# Patient Record
Sex: Female | Born: 1990 | Race: White | Hispanic: No | Marital: Married | Smoking: Never smoker
Health system: Southern US, Community
[De-identification: ages and names within clinical notes are randomized; demographics above are authoritative.]

## PROBLEM LIST (undated history)

## (undated) DIAGNOSIS — N83209 Unspecified ovarian cyst, unspecified side: Secondary | ICD-10-CM

## (undated) HISTORY — DX: Unspecified ovarian cyst, unspecified side: N83.209

## (undated) HISTORY — PX: APPENDECTOMY: SHX54

---

## 2013-03-09 ENCOUNTER — Emergency Department: Payer: Self-pay | Admitting: Emergency Medicine

## 2013-03-09 LAB — CBC WITH DIFFERENTIAL/PLATELET
Basophil #: 0 10*3/uL (ref 0.0–0.1)
Eosinophil %: 0 %
HCT: 34.2 % — ABNORMAL LOW (ref 35.0–47.0)
HGB: 12 g/dL (ref 12.0–16.0)
Lymphocyte #: 0.8 10*3/uL — ABNORMAL LOW (ref 1.0–3.6)
Lymphocyte %: 4.6 %
MCH: 32 pg (ref 26.0–34.0)
MCHC: 34.9 g/dL (ref 32.0–36.0)
MCV: 92 fL (ref 80–100)
Neutrophil #: 16.3 10*3/uL — ABNORMAL HIGH (ref 1.4–6.5)
Neutrophil %: 93.4 %
Platelet: 251 10*3/uL (ref 150–440)
RBC: 3.74 10*6/uL — ABNORMAL LOW (ref 3.80–5.20)
WBC: 17.5 10*3/uL — ABNORMAL HIGH (ref 3.6–11.0)

## 2013-03-09 LAB — COMPREHENSIVE METABOLIC PANEL
Albumin: 4 g/dL (ref 3.4–5.0)
Anion Gap: 4 — ABNORMAL LOW (ref 7–16)
BUN: 21 mg/dL — ABNORMAL HIGH (ref 7–18)
Bilirubin,Total: 0.4 mg/dL (ref 0.2–1.0)
Chloride: 106 mmol/L (ref 98–107)
Creatinine: 0.72 mg/dL (ref 0.60–1.30)
EGFR (Non-African Amer.): >60
Osmolality: 276 (ref 275–301)
Potassium: 3.7 mmol/L (ref 3.5–5.1)
SGOT(AST): 23 U/L (ref 15–37)
Sodium: 136 mmol/L (ref 136–145)
Total Protein: 7 g/dL (ref 6.4–8.2)

## 2013-03-09 LAB — URINALYSIS, COMPLETE
Blood: NEGATIVE
Glucose,UR: NEGATIVE mg/dL (ref 0–75)
Leukocyte Esterase: NEGATIVE
Nitrite: NEGATIVE
Ph: 5 (ref 4.5–8.0)
Protein: 30
Specific Gravity: 1.027 (ref 1.003–1.030)
Squamous Epithelial: 43
WBC UR: 12 /HPF (ref 0–5)

## 2013-03-09 LAB — CBC
HCT: 27.9 % — ABNORMAL LOW (ref 35.0–47.0)
HGB: 9.7 g/dL — ABNORMAL LOW (ref 12.0–16.0)
MCH: 32 pg (ref 26.0–34.0)
MCV: 93 fL (ref 80–100)
RBC: 3.02 10*6/uL — ABNORMAL LOW (ref 3.80–5.20)
WBC: 12.2 10*3/uL — ABNORMAL HIGH (ref 3.6–11.0)

## 2013-03-09 LAB — LIPASE, BLOOD: Lipase: 88 U/L (ref 73–393)

## 2013-12-19 IMAGING — US US PELV - US TRANSVAGINAL
1 series · 14 of 25 positions shown · non-contrast
Comparison: CT 03/09/2013

CLINICAL DATA: Hemoperitoneum.  Evaluate for ruptured cyst.

EXAM:
TRANSABDOMINAL AND TRANSVAGINAL ULTRASOUND OF PELVIS
TECHNIQUE: Both transabdominal and transvaginal ultrasound examinations of the
pelvis were performed. Transabdominal technique was performed for
global imaging of the pelvis including uterus, ovaries, adnexal
regions, and pelvic cul-de-sac. It was necessary to proceed with
endovaginal exam following the transabdominal exam to visualize the
right ovary.

[Series 1: us pelv - us transvaginal · 0.28mm/px · 85 acquisitions, 14 frames shown]
[im 1/85]
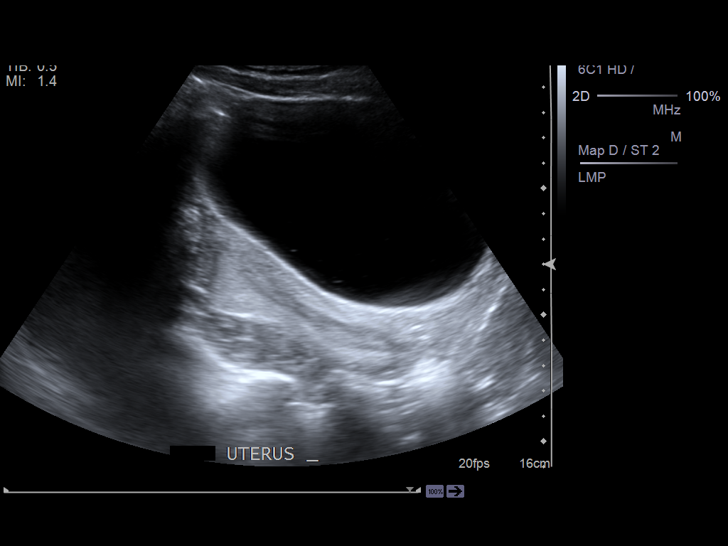
[im 8/85]
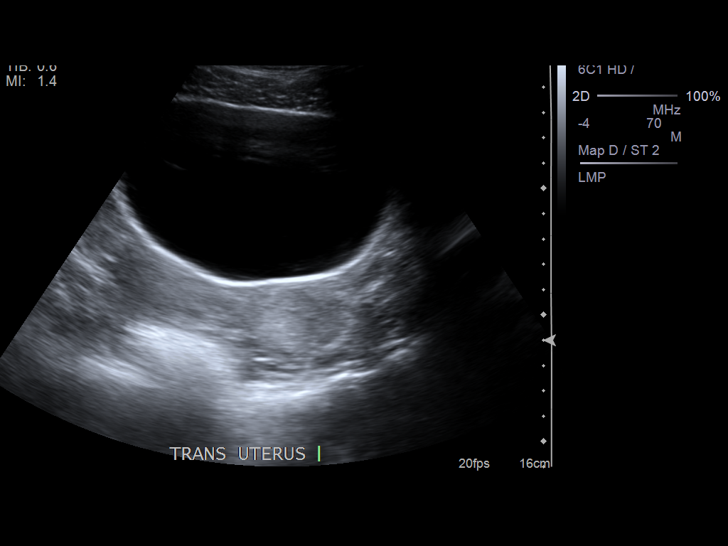
[im 15/85]
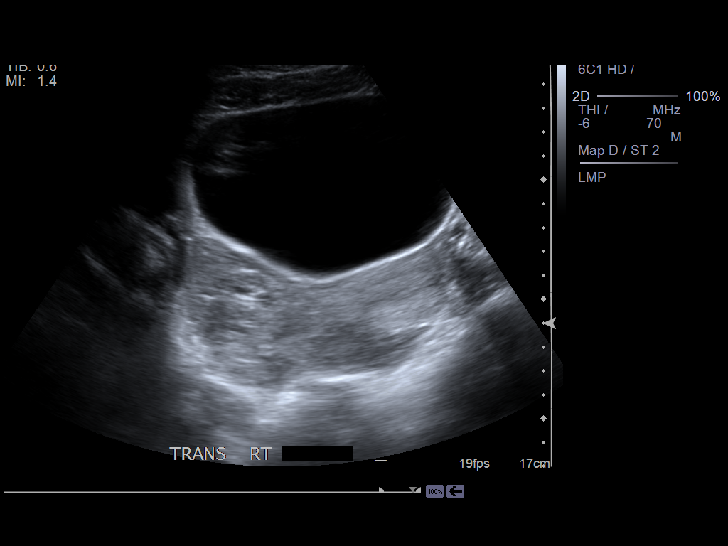
[im 22/85]
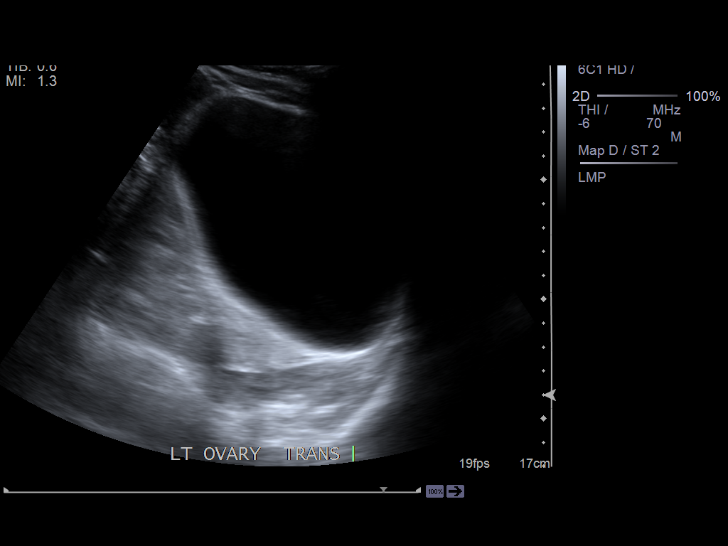
[im 29/85]
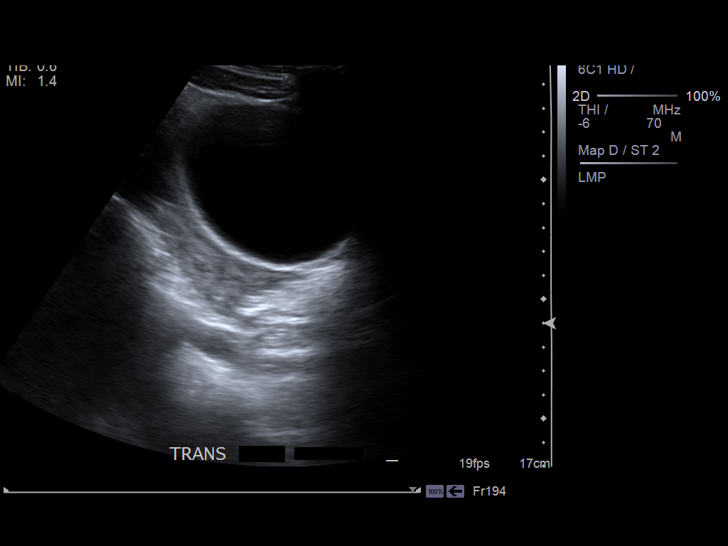
[im 32/85]
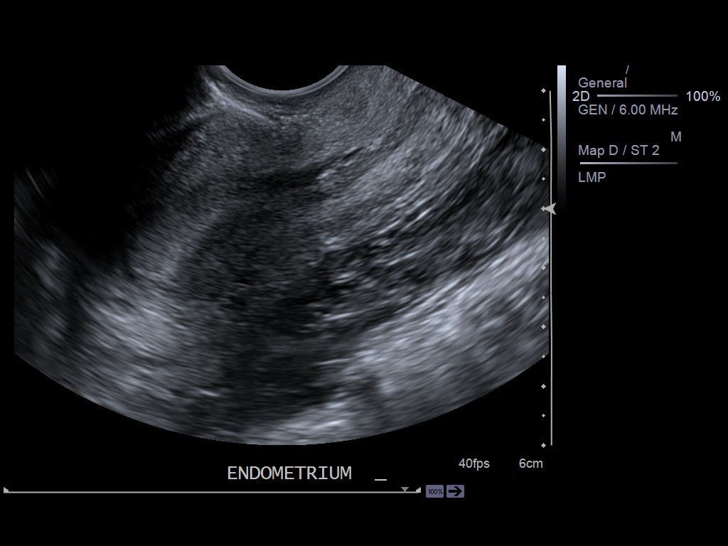
[im 39/85]
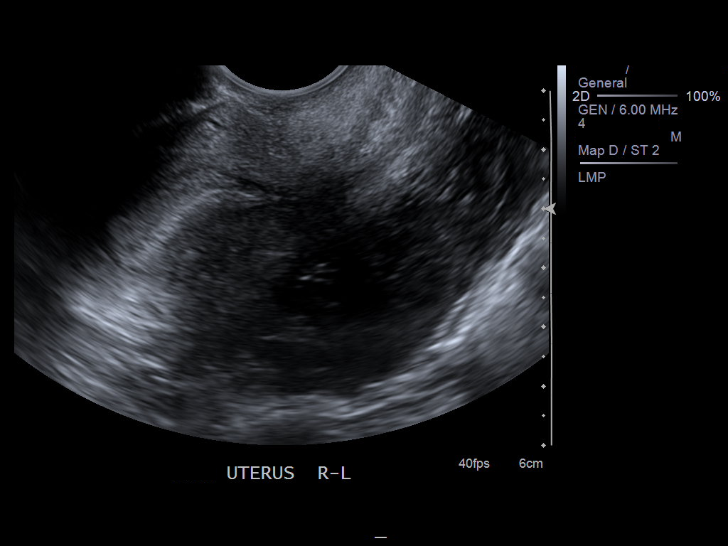
[im 46/85]
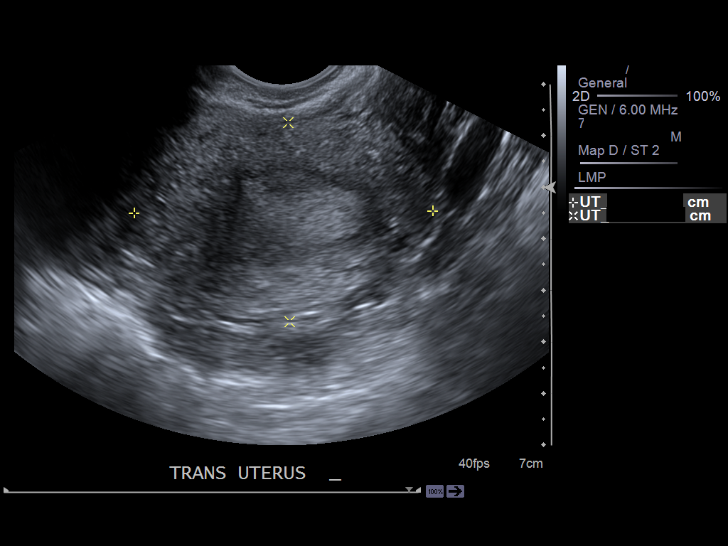
[im 53/85]
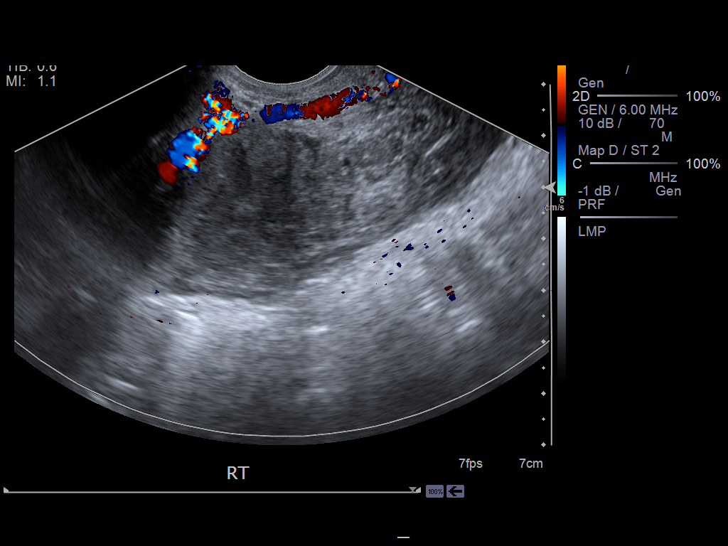
[im 57/85]
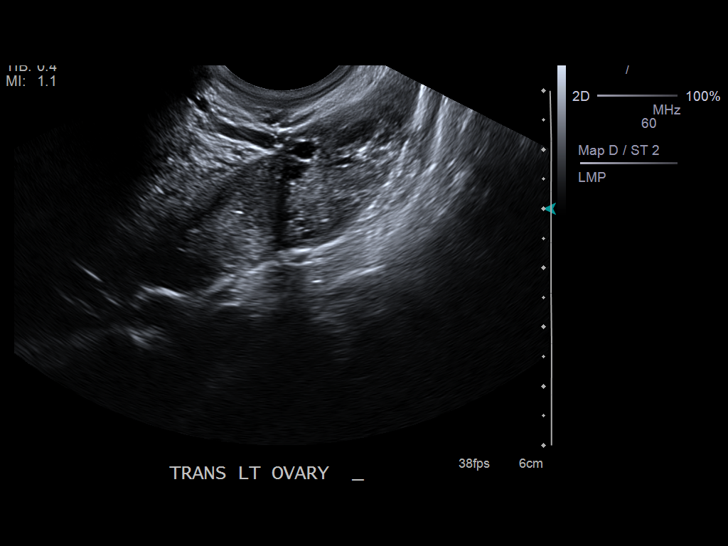
[im 64/85]
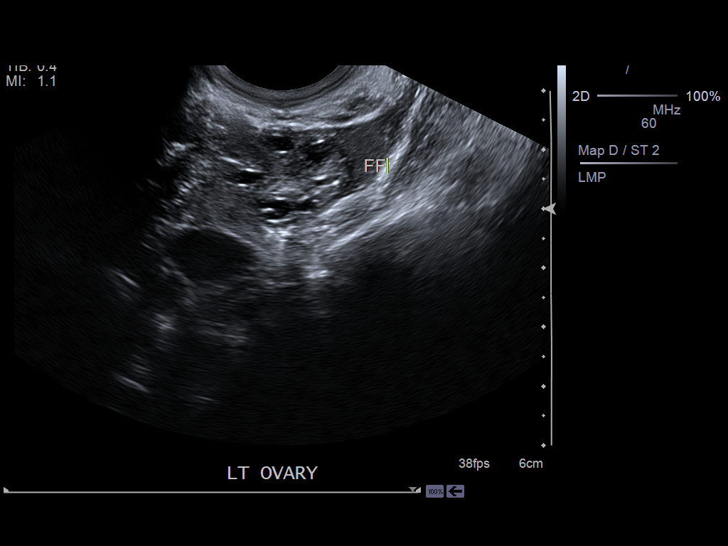
[im 71/85]
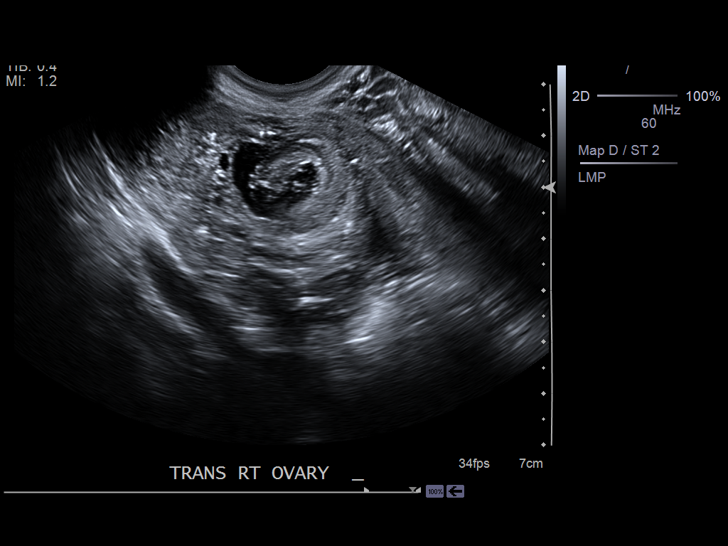
[im 78/85]
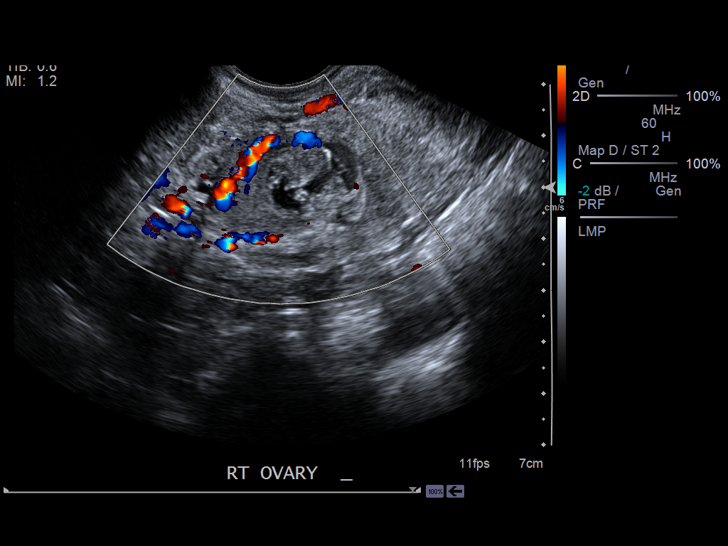
[im 85/85]
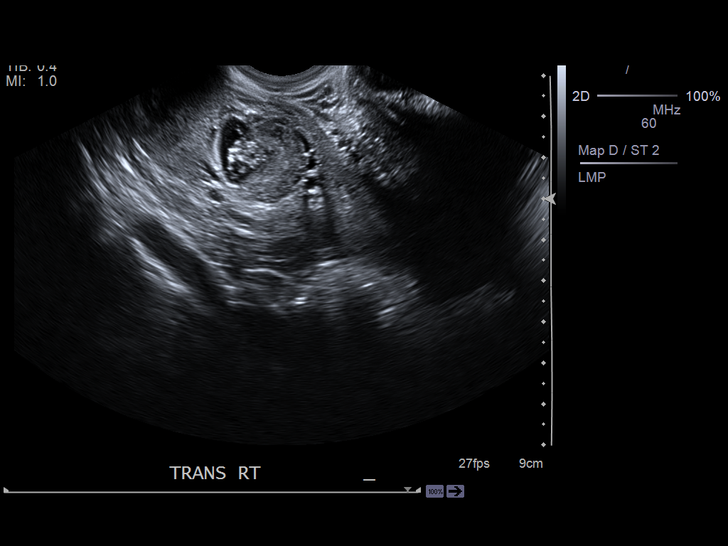

[14 of 25 positions shown; findings below may reference images not displayed]

FINDINGS: Uterus

Measurements: 8.2 x 3.9 x 5.8 cm. No fibroids or other mass
visualized.

Endometrium

Thickness: 12 mm. Fluid is identified within the lower uterine
segment.

Right ovary

Measurements: 2.3 x 3.2 x 1.9 cm.. Complex cystic mass in the right
ovary measures 2.4 x 1.7 x 1.9 cm.

Left ovary

Measurements: 3.1 x 3.7 x 1.8 cm. Normal appearance/no adnexal mass.

Other findings

Complex fluid is identified compatible with hemoperitoneum.
IMPRESSION: 1. Complex mass in right ovary compatible with collapsed a
hemorrhagic cyst. In the setting of a negative per pregnancy test is
most likely represents sequelae of a ruptured hemorrhagic cyst.

## 2013-12-19 IMAGING — CT CT ABD-PELV W/ CM
2 of 4 series · 17 of 46 positions shown, 19 images · IV contrast (isovue)
Comparison: None.

CLINICAL DATA: Generalized abdominal pain, worse in the right lower
quadrant

EXAM:
CT ABDOMEN AND PELVIS WITH CONTRAST
TECHNIQUE: Multidetector CT imaging of the abdomen and pelvis was performed
using the standard protocol following bolus administration of
intravenous contrast.
CONTRAST:  125 cc Isovue 370 intravenous

[Series 2: routine abd pel with · axial · 0.63mm/px · z∈[-474,-40]mm · 14 of 95 slices shown, 16 images]
[im 4/95  soft-tissue]
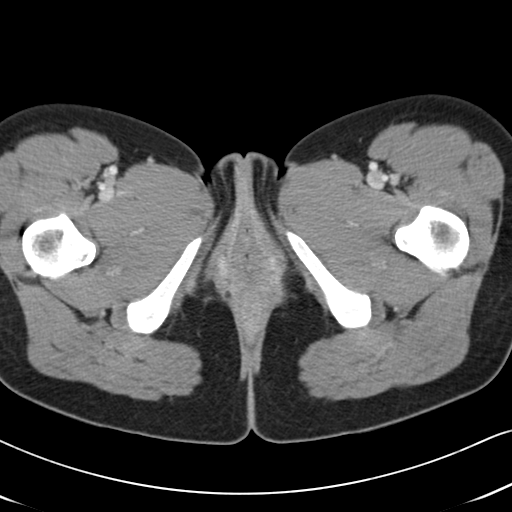
[im 4/95  bone]
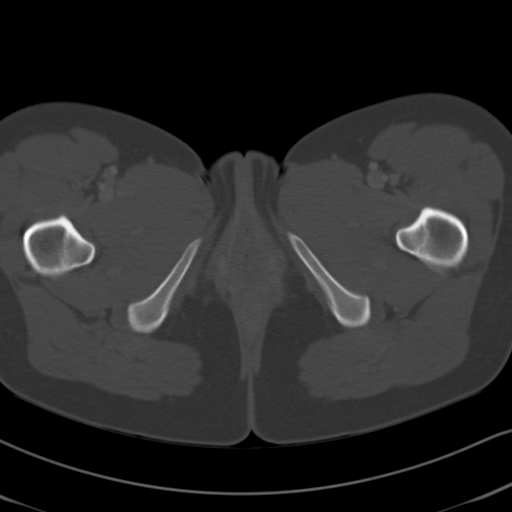
[im 12/95  soft-tissue]
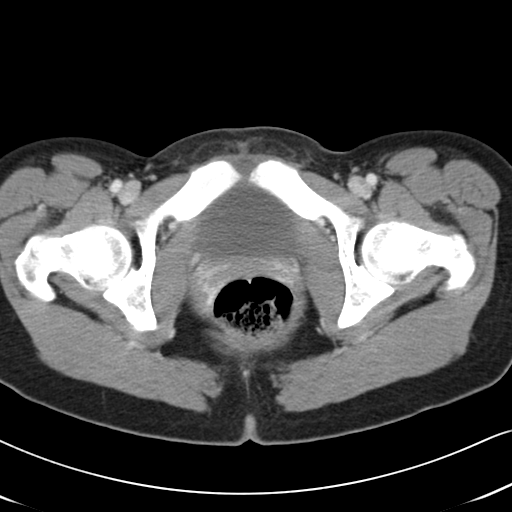
[im 19/95  soft-tissue]
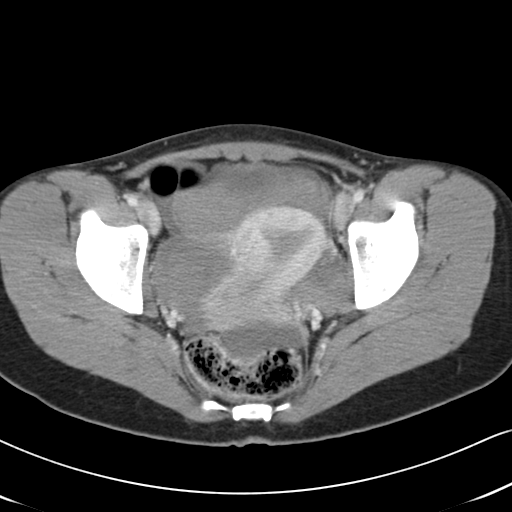
[im 27/95  soft-tissue]
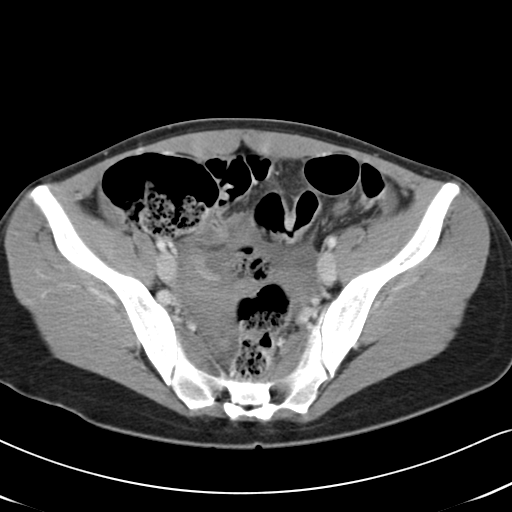
[im 31/95  soft-tissue]
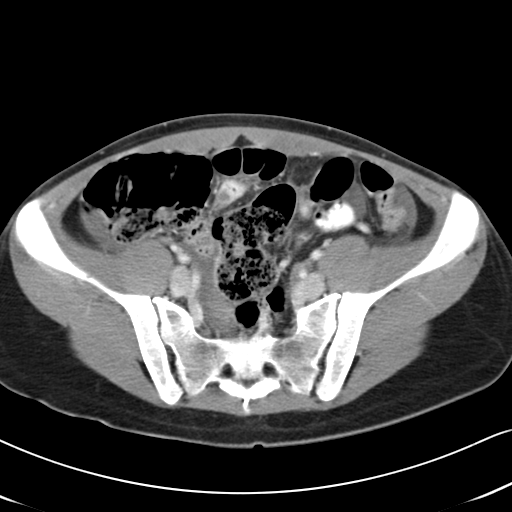
[im 38/95  soft-tissue]
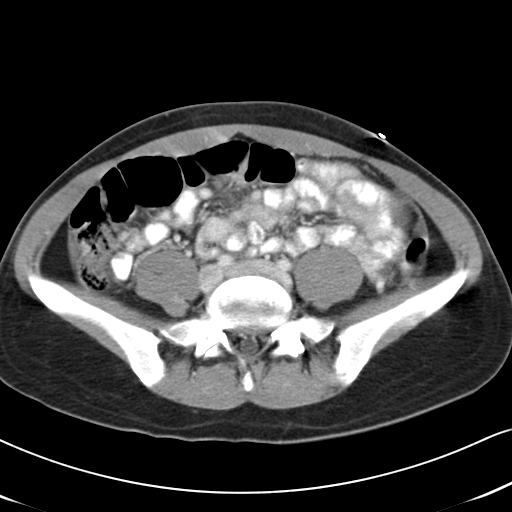
[im 46/95  soft-tissue]
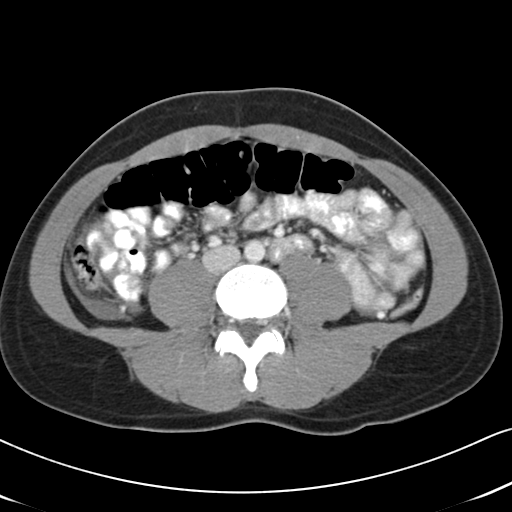
[im 49/95  soft-tissue]
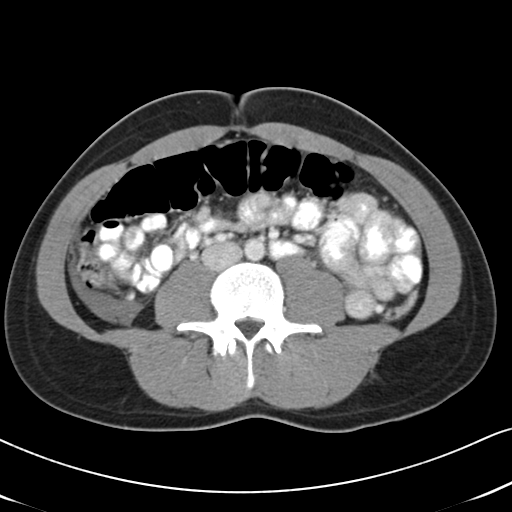
[im 57/95  soft-tissue]
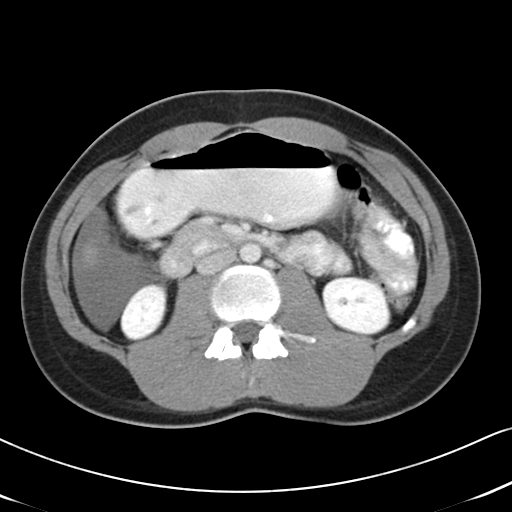
[im 57/95  bone]
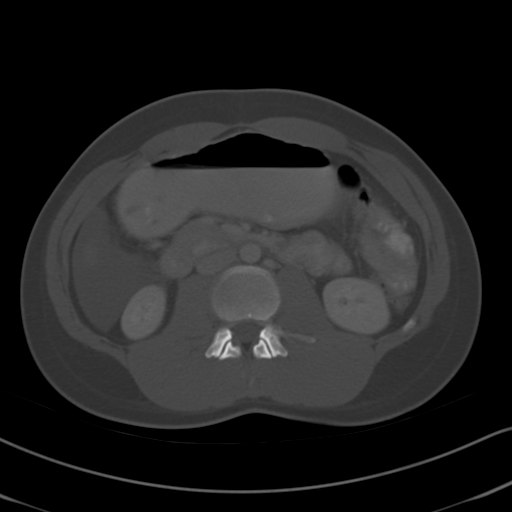
[im 64/95  soft-tissue]
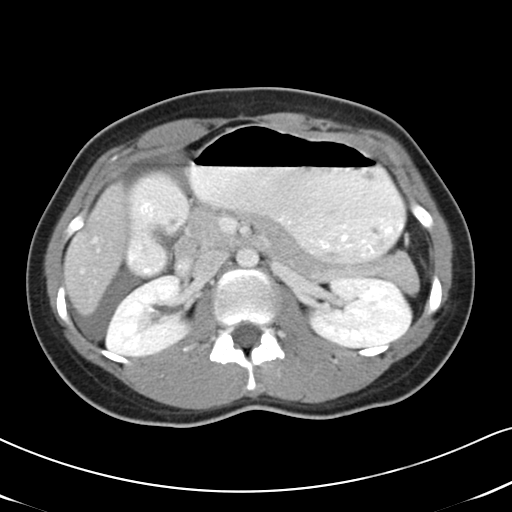
[im 72/95  soft-tissue]
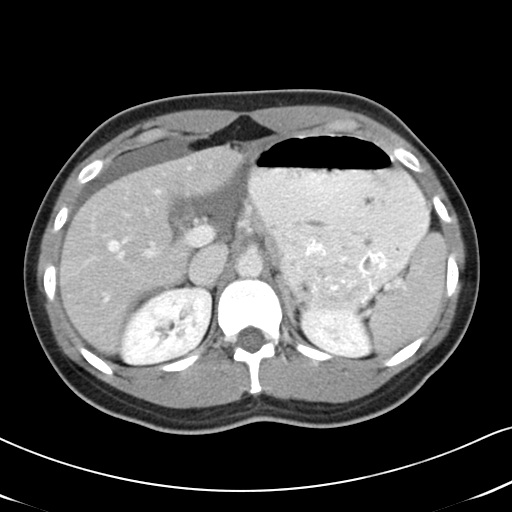
[im 76/95  soft-tissue]
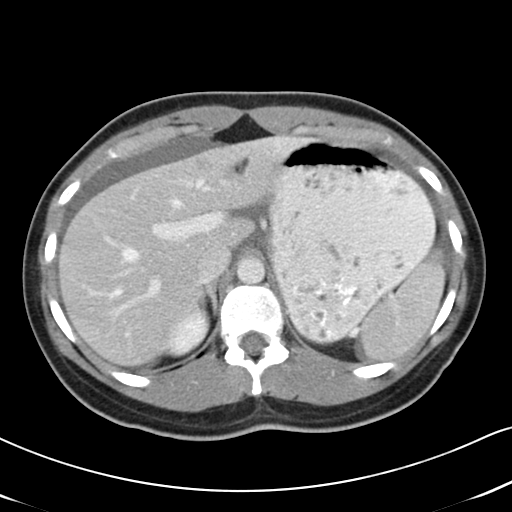
[im 83/95  soft-tissue]
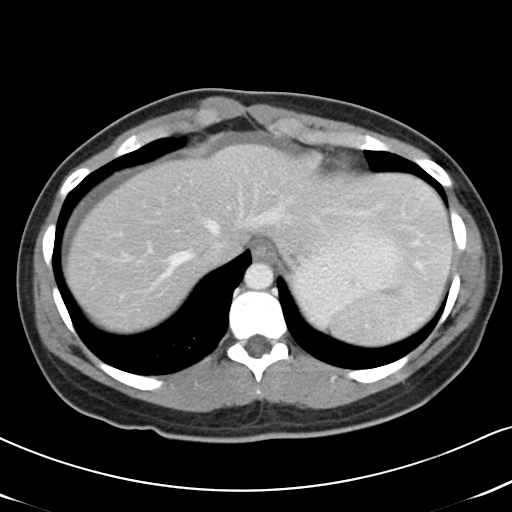
[im 91/95  soft-tissue]
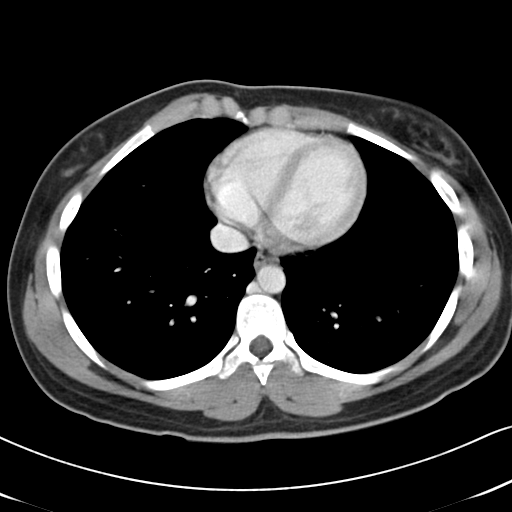

[Series 5: cor routine abd pel with · coronal · 0.94mm/px · 3 of 109 slices shown]
[im 37/109  soft-tissue]
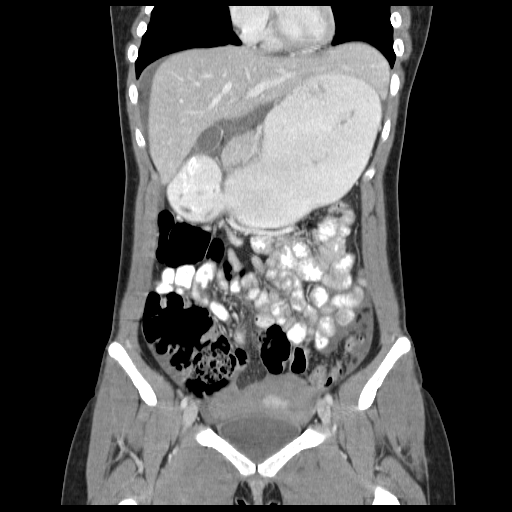
[im 49/109  soft-tissue]
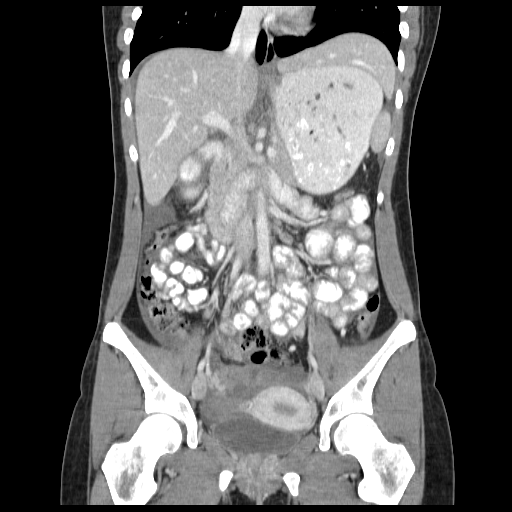
[im 61/109  soft-tissue]
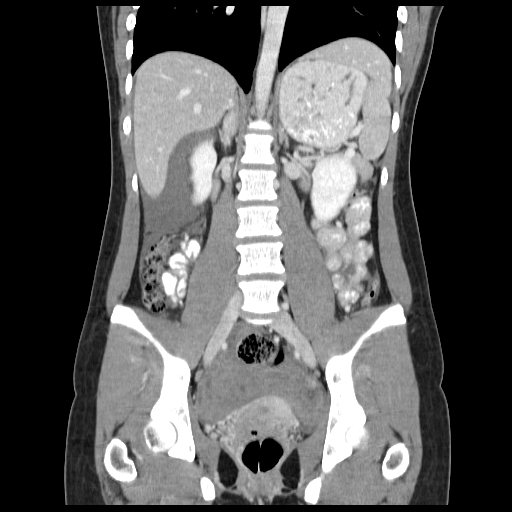

[17 of 46 positions shown; findings below may reference images not displayed]

FINDINGS: BODY WALL: Unremarkable.

LOWER CHEST: Unremarkable.

ABDOMEN/PELVIS:

Liver: No focal abnormality.

Biliary: No evidence of biliary obstruction or stone.

Pancreas: Unremarkable.

Spleen: Unremarkable.

Adrenals: Unremarkable.

Kidneys and ureters: No hydronephrosis or stone.

Bladder: Unremarkable.

Reproductive: See below

Bowel: No obstruction. Normal appendix.

Retroperitoneum: No mass or adenopathy.

Peritoneum: Moderate hemo peritoneum, with the blood clot centered
in the pelvis. More fluid density extends to the level of the
diaphragm. The likely source is the right adnexa, probably from
ruptured hemorrhagic cyst. Amorphous contrast within the right
adnexal region could represent ongoing hemorrhage or intravascular
contrast within the wall of the corpus luteum cyst. Delayed imaging
not performed.

OSSEOUS: No acute abnormalities.

Critical Value/emergent results were called by telephone at the time
of interpretation on 03/09/2013 at [DATE] to Dr.ZYRIE REBEAUD ,
who verbally acknowledged these results.
IMPRESSION: Moderate hemoperitoneum, most likely from ruptured hemorrhagic
ovarian cyst on the right. There may be ongoing hemorrhage.

## 2014-08-08 NOTE — H&P (Signed)
Subjective/Chief Complaint abdominal pain   History of Present Illness 24 yo G0 presents to the Emergency Room at [facility] with worsening abdominal pain.  She states the pain began around 1130 last night and gradually worsened whent, at 430 am, her pain became bad enough to have her come to the Emergency Room.  She states that pain is located in her lower abdomen and in her shoulder.  She states the pain otherwise doesn't radiate.  The pain is described as 10 out of 10 and sharp and constant.  Nothing alleviates the pain, though she has received a total of 27m (179m 86m85m and 2mg59mf morphine in the ER this morning and her pain is now a 6 out of 10.  She notes no aggravating factors.  She had some associated dizzyness once, but none now.  She denies fevers, chills, nausea, emesis, urinary symptom, GI symptoms.  Her LMP was approximately 11/1.  She also denies any vaginal symptoms.   Past History Past Medical History:  No medical problems Past Surgical History: None OB/GYN history: G0, LMP 02/16/13, Denies history of STD, never had a pap smear   Past Med/Surgical Hx:  denies:   ALLERGIES:  Amoxicillin: Rash   Medications No home medications   Family and Social History:  Family History Non-Contributory   Social History negative tobacco, negative ETOH, negative Illicit drugs   Place of Living Home   Review of Systems:  Fever/Chills No   Cough No   Abdominal Pain Yes   Diarrhea No   Constipation No   Nausea/Vomiting No   SOB/DOE No   Chest Pain No   Medications/Allergies Reviewed Medications/Allergies reviewed   Physical Exam:  GEN well developed, well nourished, no acute distress   NECK supple  No masses  trachea midline   RESP normal resp effort  clear BS   CARD regular rate   ABD soft, mild tenderness to palpation in lower abdomen, no rebound, no guarding, +BS   GU superpubic tenderness  deferred   EXTR negative  cyanosis/clubbing, negative edema   SKIN normal to palpation   NEURO cranial nerves intact, motor/sensory function intact   PSYCH A+O to time, place, person   Additional Comments Height : 5 feet, 4 inch, BMI (kg/m2) : 23.1  Temp 98 degrees F, Pulse 111, RR 18, BP : 114/64, Pulse Ox % : 98 % Pain Scale (0-10) Pain Scale (0-10) : Scale:7  CT abd/pelvis w/ contrast: hemoperitoneum/pelvis with possible rupture right ovarian corpus luteal cyst.   Lab Results:  Hepatic:  22-Nov-14 05:13   Bilirubin, Total 0.4  Alkaline Phosphatase 46 (45-117 NOTE: New Reference Range 03/08/13)  SGPT (ALT) 17  SGOT (AST) 23  Total Protein, Serum 7.0  Albumin, Serum 4.0  Routine Chem:  22-Nov-14 05:13   Lipase 88 (Result(s) reported on 09 Mar 2013 at 05:37AM.)  Glucose, Serum  111  BUN  21  Creatinine (comp) 0.72  Sodium, Serum 136  Potassium, Serum 3.7  Chloride, Serum 106  CO2, Serum 26  Calcium (Total), Serum 8.9  Osmolality (calc) 276  eGFR (African American) >60  eGFR (Non-African American) >60 (eGFR values <60mL34m/1.73 m2 may be an indication of chronic kidney disease (CKD). Calculated eGFR is useful in patients with stable renal function. The eGFR calculation will not be reliable in acutely ill patients when serum creatinine is changing rapidly. It is not useful in  patients on dialysis. The eGFR calculation may not be applicable to patients  at the low and high extremes of body sizes, pregnant women, and vegetarians.)  Anion Gap  4  Routine UA:  22-Nov-14 05:13   Color (UA) Yellow  Clarity (UA) Cloudy  Glucose (UA) Negative  Bilirubin (UA) Negative  Ketones (UA) 2+  Specific Gravity (UA) 1.027  Blood (UA) Negative  pH (UA) 5.0  Protein (UA) 30 mg/dL  Nitrite (UA) Negative  Leukocyte Esterase (UA) Negative (Result(s) reported on 09 Mar 2013 at 06:13AM.)  RBC (UA) 2 /HPF  WBC (UA) 12 /HPF  Bacteria (UA) 1+  Epithelial Cells (UA) 43 /HPF  Mucous (UA) PRESENT (Result(s)  reported on 09 Mar 2013 at 06:13AM.)  Routine Hem:  22-Nov-14 05:13   WBC (CBC)  17.5  RBC (CBC)  3.74  Hemoglobin (CBC) 12.0  Hematocrit (CBC)  34.2  Platelet Count (CBC) 251  MCV 92  MCH 32.0  MCHC 34.9  RDW 12.0  Neutrophil % 93.4  Lymphocyte % 4.6  Monocyte % 1.9  Eosinophil % 0.0  Basophil % 0.1  Neutrophil #  16.3  Lymphocyte #  0.8  Monocyte # 0.3  Eosinophil # 0.0  Basophil # 0.0 (Result(s) reported on 09 Mar 2013 at 05:37AM.)   Radiology Results: CT:    22-Nov-14 06:16, CT Abdomen and Pelvis With Contrast  CT Abdomen and Pelvis With Contrast  REASON FOR EXAM:    (1) generalized abdominal pain worse RLQ; (2)   generalized abdominal pain worse R  COMMENTS:       PROCEDURE: CT  - CT ABDOMEN / PELVIS  W  - Mar 09 2013  6:16AM     CLINICAL DATA:  Generalized abdominal pain, worse in the right lower  quadrant    EXAM:  CT ABDOMEN AND PELVIS WITH CONTRAST    TECHNIQUE:  Multidetector CT imaging of the abdomen and pelvis was performed  using the standard protocol following bolus administration of  intravenous contrast.    CONTRAST:  125 cc Isovue 370 intravenous    COMPARISON:  None.    FINDINGS:  BODY WALL: Unremarkable.    LOWER CHEST: Unremarkable.    ABDOMEN/PELVIS:    Liver: No focal abnormality.  Biliary: No evidence of biliary obstruction or stone.    Pancreas: Unremarkable.    Spleen: Unremarkable.    Adrenals: Unremarkable.    Kidneys and ureters: No hydronephrosis or stone.    Bladder: Unremarkable.    Reproductive: See below    Bowel: No obstruction. Normal appendix.  Retroperitoneum: No mass or adenopathy.    Peritoneum: Moderate hemo peritoneum, with the blood clot centered  in the pelvis. More fluid density extends to the level of the  diaphragm. The likely source is the right adnexa, probably from  ruptured hemorrhagic cyst. Amorphous contrast within the right  adnexal region could represent ongoing hemorrhage or  intravascular  contrast within the wall of the corpus luteum cyst. Delayed imaging  not performed.    OSSEOUS: No acute abnormalities.    Critical Value/emergent results were called by telephone at the time  of interpretation on 03/09/2013 at 6:38 AM to [facility] ,  who verbally acknowledged these results.   IMPRESSION:  Moderate hemoperitoneum, most likely from ruptured hemorrhagic  ovarian cyst on the right. There may be ongoing hemorrhage.      Electronically Signed    By: Jorje Guild M.D.    On: 03/09/2013 06:42         Verified By: Gilford Silvius, M.D.,    Assessment/Admission Diagnosis  24 yo G0 with hemoperitoneum with possible gynecologic source, possible ruptured hemorrhagic cyst   Plan - Recommend pelvic ultrasound to better characterize (discussed with patient that she will likely need a transvaginal approach) - Repeat blood counts to assess stability. I do expect some drop in her counts as her initial labs may not reflect an equilibrated states.  Reason for repeating labs is to ensure ongoing hemorrhage not present. - Follow up with ultrasound result with Dr. Cristino Martes who is the oncoming covering gynecologist. Disposition per her.   Electronic Signatures: Will Bonnet (MD)  (Signed [phone] 08:40)  Authored: CHIEF COMPLAINT and HISTORY, PAST MEDICAL/SURGIAL HISTORY, ALLERGIES, HOME MEDICATIONS, OTHER MEDICATIONS, FAMILY AND SOCIAL HISTORY, REVIEW OF SYSTEMS, PHYSICAL EXAM, LABS, Radiology, ASSESSMENT AND PLAN   Last Updated: 22-Nov-14 08:40 by Will Bonnet (MD)

## 2017-03-23 ENCOUNTER — Other Ambulatory Visit: Payer: Self-pay | Admitting: Obstetrics and Gynecology

## 2017-03-23 ENCOUNTER — Encounter: Payer: Self-pay | Admitting: Obstetrics and Gynecology

## 2017-03-23 ENCOUNTER — Ambulatory Visit: Payer: BLUE CROSS/BLUE SHIELD | Admitting: Obstetrics and Gynecology

## 2017-03-23 VITALS — BP 108/74 | HR 98 | Ht 64.0 in | Wt 138.0 lb

## 2017-03-23 DIAGNOSIS — Z01419 Encounter for gynecological examination (general) (routine) without abnormal findings: Secondary | ICD-10-CM | POA: Diagnosis not present

## 2017-03-23 DIAGNOSIS — Z23 Encounter for immunization: Secondary | ICD-10-CM

## 2017-03-23 NOTE — Patient Instructions (Signed)
  Place annual gynecologic exam patient instructions here.  Thank you for enrolling in MyChart. Please follow the instructions below to securely access your online medical record. MyChart allows you to send messages to your doctor, view your test results, manage appointments, and more.   How Do I Sign Up? 1. In your Internet browser, go to [employer] and enter [email or web address]. 2. Click on the Sign Up Now link in the Sign In box. You will see the New Member Sign Up page. 3. Enter your MyChart Access Code exactly as it appears below. You will not need to use this code after you've completed the sign-up process. If you do not sign up before the expiration date, you must request a new code.  MyChart Access Code: [email or web address] Expires: 05/07/2017 10:29 AM  4. Enter your Social Security Number ([id number]) and Date of Birth (mm/dd/yyyy) as indicated and click Submit. You will be taken to the next sign-up page. 5. Create a MyChart ID. This will be your MyChart login ID and cannot be changed, so think of one that is secure and easy to remember. 6. Create a MyChart password. You can change your password at any time. 7. Enter your Password Reset Question and Answer. This can be used at a later time if you forget your password.  8. Enter your e-mail address. You will receive e-mail notification when new information is available in MyChart. 9. Click Sign Up. You can now view your medical record.   Additional Information Remember, MyChart is NOT to be used for urgent needs. For medical emergencies, dial 911.

## 2017-03-23 NOTE — Progress Notes (Signed)
  Subjective:     Karen Zuniga is a engaged white  26 y.o. female and is here for a comprehensive physical exam. She works FT at [employer]. Is sexually active using condoms.  The patient reports no problems. 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 Social History   Socioeconomic History  . Marital status: Single    Spouse name: Not on file  . Number of children: Not on file  . Years of education: Not on file  . Highest education level: Not on file  Social Needs  . Financial resource strain: Not on file  . Food insecurity - worry: Not on file  . Food insecurity - inability: Not on file  . Transportation needs - medical: Not on file  . Transportation needs - non-medical: Not on file  Occupational History  . Not on file  Tobacco Use  . Smoking status: Never Smoker  . Smokeless tobacco: Never Used  Substance and Sexual Activity  . Alcohol use: Yes    Alcohol/week: 0.6 oz    Types: 1 Glasses of wine per week  . Drug use: No  . Sexual activity: Yes    Birth control/protection: Condom  Other Topics Concern  . Not on file  Social History Narrative  . Not on file   Health Maintenance  Topic Date Due  . HIV Screening  07/22/2007  . TETANUS/TDAP  07/22/2011  . PAP SMEAR  07/21/2013  . INFLUENZA VACCINE  11/16/2016    The following portions of the patient's history were reviewed and updated as appropriate: allergies, current medications, past family history, past medical history, past social history, past surgical history and problem list.  Review of Systems Pertinent items noted in HPI and remainder of comprehensive ROS otherwise negative.   Objective:    General appearance: alert, cooperative and appears stated age Neck: no adenopathy, no carotid bruit, no JVD, supple, symmetrical, trachea midline and thyroid not enlarged, symmetric, no tenderness/mass/nodules Lungs: clear to auscultation bilaterally Breasts: normal  appearance, no masses or tenderness Heart: regular rate and rhythm, S1, S2 normal, no murmur, click, rub or gallop Abdomen: soft, non-tender; bowel sounds normal; no masses,  no organomegaly Pelvic: cervix normal in appearance, external genitalia normal, no adnexal masses or tenderness, no cervical motion tenderness, rectovaginal septum normal, uterus normal size, shape, and consistency and vagina normal without discharge    Assessment:    Healthy female exam.  Needs flu & Tdap vaccines     Plan:  Flu & Tdap given RTC 1 year or as needed.  Melody Shambley,CNM   See After Visit Summary for Counseling Recommendations

## 2017-03-24 LAB — CYTOLOGY - PAP

## 2017-07-04 ENCOUNTER — Telehealth: Payer: Self-pay | Admitting: Obstetrics and Gynecology

## 2017-07-04 NOTE — Telephone Encounter (Signed)
The patient called and stated that she would like to have her nurse give her a call back in regards to her wanting to have some testing done for PCOS. No other information was advised. Please advise.

## 2017-07-05 NOTE — Telephone Encounter (Signed)
Pt had annual 12/18, wants labs /US for PCOS sx, ok to set up??

## 2017-07-05 NOTE — Telephone Encounter (Signed)
Yes please set it up.

## 2017-07-06 ENCOUNTER — Other Ambulatory Visit: Payer: Self-pay | Admitting: Obstetrics and Gynecology

## 2017-07-06 DIAGNOSIS — N926 Irregular menstruation, unspecified: Secondary | ICD-10-CM

## 2017-07-10 ENCOUNTER — Ambulatory Visit (INDEPENDENT_AMBULATORY_CARE_PROVIDER_SITE_OTHER): Payer: BLUE CROSS/BLUE SHIELD

## 2017-07-10 ENCOUNTER — Ambulatory Visit: Payer: BLUE CROSS/BLUE SHIELD

## 2017-07-10 DIAGNOSIS — E282 Polycystic ovarian syndrome: Secondary | ICD-10-CM

## 2017-07-10 DIAGNOSIS — N926 Irregular menstruation, unspecified: Secondary | ICD-10-CM

## 2017-07-11 LAB — PROGESTERONE: PROGESTERONE: 0.2 ng/mL

## 2017-07-11 LAB — FSH/LH
FSH: 3.8 m[IU]/mL
LH: 9.5 m[IU]/mL

## 2017-07-11 LAB — TESTOSTERONE: Testosterone: 33 ng/dL (ref 8–48)

## 2017-07-11 LAB — DHEA-SULFATE: DHEA SO4: 321.9 ug/dL (ref 110.0–431.7)

## 2017-07-11 LAB — TSH: TSH: 1.16 u[IU]/mL (ref 0.450–4.500)

## 2017-07-11 LAB — PROLACTIN: Prolactin: 20.4 ng/mL (ref 4.8–23.3)

## 2017-07-17 ENCOUNTER — Telehealth: Payer: Self-pay | Admitting: *Deleted

## 2017-07-17 NOTE — Telephone Encounter (Signed)
Patient called and is inquiring about her lab results and her U/s results. Patient is requesting a call back. Her contact # is [phone]. Please advise. Thank you

## 2017-07-18 ENCOUNTER — Telehealth: Payer: Self-pay | Admitting: Obstetrics and Gynecology

## 2017-07-18 NOTE — Telephone Encounter (Signed)
pls advise

## 2017-07-18 NOTE — Telephone Encounter (Signed)
Patient called requesting lab results. Thanks.

## 2017-11-15 ENCOUNTER — Encounter
Admission: EM | Disposition: A | Discharge status: Home / Self Care | Payer: Self-pay | Source: Home / Self Care | Attending: Emergency Medicine

## 2017-11-15 ENCOUNTER — Other Ambulatory Visit: Payer: Self-pay

## 2017-11-15 ENCOUNTER — Emergency Department: Payer: BLUE CROSS/BLUE SHIELD | Admitting: Anesthesiology

## 2017-11-15 ENCOUNTER — Emergency Department: Payer: BLUE CROSS/BLUE SHIELD

## 2017-11-15 ENCOUNTER — Emergency Department
Admission: EM | Admit: 2017-11-15 | Discharge: 2017-11-15 | Disposition: A | Discharge status: Home / Self Care | Payer: BLUE CROSS/BLUE SHIELD | Attending: Emergency Medicine | Admitting: Emergency Medicine

## 2017-11-15 ENCOUNTER — Encounter: Payer: Self-pay | Admitting: Emergency Medicine

## 2017-11-15 DIAGNOSIS — Z880 Allergy status to penicillin: Secondary | ICD-10-CM | POA: Insufficient documentation

## 2017-11-15 DIAGNOSIS — K3580 Unspecified acute appendicitis: Secondary | ICD-10-CM | POA: Diagnosis not present

## 2017-11-15 DIAGNOSIS — R1031 Right lower quadrant pain: Secondary | ICD-10-CM

## 2017-11-15 HISTORY — PX: LAPAROSCOPIC APPENDECTOMY: SHX408

## 2017-11-15 LAB — COMPREHENSIVE METABOLIC PANEL
ALBUMIN: 5 g/dL (ref 3.5–5.0)
ALT: 16 U/L (ref 0–44)
ANION GAP: 12 (ref 5–15)
AST: 23 U/L (ref 15–41)
Alkaline Phosphatase: 44 U/L (ref 38–126)
BILIRUBIN TOTAL: 0.9 mg/dL (ref 0.3–1.2)
BUN: 17 mg/dL (ref 6–20)
CO2: 24 mmol/L (ref 22–32)
Calcium: 10 mg/dL (ref 8.9–10.3)
Chloride: 102 mmol/L (ref 98–111)
Creatinine, Ser: 0.7 mg/dL (ref 0.44–1.00)
GFR calc Af Amer: >60 mL/min (ref >60)
GFR calc non Af Amer: >60 mL/min (ref >60)
GLUCOSE: 115 mg/dL — AB (ref 70–99)
Potassium: 4 mmol/L (ref 3.5–5.1)
SODIUM: 138 mmol/L (ref 135–145)
Total Protein: 7.9 g/dL (ref 6.5–8.1)

## 2017-11-15 LAB — CBC
HCT: 40.5 % (ref 35.0–47.0)
Hemoglobin: 14.2 g/dL (ref 12.0–16.0)
MCH: 33.4 pg (ref 26.0–34.0)
MCHC: 35.1 g/dL (ref 32.0–36.0)
MCV: 95.2 fL (ref 80.0–100.0)
PLATELETS: 275 10*3/uL (ref 150–440)
RBC: 4.25 MIL/uL (ref 3.80–5.20)
RDW: 12.5 % (ref 11.5–14.5)
WBC: 10.6 10*3/uL (ref 3.6–11.0)

## 2017-11-15 LAB — URINALYSIS, COMPLETE (UACMP) WITH MICROSCOPIC
Bacteria, UA: NONE SEEN
Bilirubin Urine: NEGATIVE
GLUCOSE, UA: NEGATIVE mg/dL
HGB URINE DIPSTICK: NEGATIVE
Ketones, ur: 20 mg/dL — AB
Leukocytes, UA: NEGATIVE
Nitrite: NEGATIVE
PH: 5 (ref 5.0–8.0)
Protein, ur: NEGATIVE mg/dL
SPECIFIC GRAVITY, URINE: 1.017 (ref 1.005–1.030)

## 2017-11-15 LAB — POCT PREGNANCY, URINE: Preg Test, Ur: NEGATIVE

## 2017-11-15 LAB — LIPASE, BLOOD: Lipase: 29 U/L (ref 11–51)

## 2017-11-15 SURGERY — APPENDECTOMY, LAPAROSCOPIC
Anesthesia: General | Site: Abdomen | Wound class: Clean Contaminated

## 2017-11-15 MED ORDER — ACETAMINOPHEN 10 MG/ML IV SOLN
INTRAVENOUS | Status: DC | PRN
  Administered 2017-11-15: 1000 mg via INTRAVENOUS

## 2017-11-15 MED ORDER — BUPIVACAINE-EPINEPHRINE (PF) 0.25% -1:200000 IJ SOLN
INTRAMUSCULAR | Status: AC
  Filled 2017-11-15: qty 30

## 2017-11-15 MED ORDER — MIDAZOLAM HCL 2 MG/2ML IJ SOLN
INTRAMUSCULAR | Status: AC
  Filled 2017-11-15: qty 2

## 2017-11-15 MED ORDER — ACETAMINOPHEN 160 MG/5ML PO SOLN
325.0000 mg | ORAL | Status: DC | PRN
  Filled 2017-11-15: qty 20.3

## 2017-11-15 MED ORDER — IOPAMIDOL (ISOVUE-370) INJECTION 76%
75.0000 mL | Freq: Once | INTRAVENOUS | Status: AC | PRN
  Administered 2017-11-15: 75 mL via INTRAVENOUS

## 2017-11-15 MED ORDER — MORPHINE SULFATE (PF) 2 MG/ML IV SOLN
2.0000 mg | Freq: Once | INTRAVENOUS | Status: AC
  Administered 2017-11-15: 2 mg via INTRAVENOUS
  Filled 2017-11-15: qty 1

## 2017-11-15 MED ORDER — MIDAZOLAM HCL 2 MG/2ML IJ SOLN
INTRAMUSCULAR | Status: DC | PRN
  Administered 2017-11-15: 2 mg via INTRAVENOUS

## 2017-11-15 MED ORDER — BUPIVACAINE-EPINEPHRINE 0.25% -1:200000 IJ SOLN
INTRAMUSCULAR | Status: DC | PRN
  Administered 2017-11-15: 30 mL

## 2017-11-15 MED ORDER — SUGAMMADEX SODIUM 200 MG/2ML IV SOLN
INTRAVENOUS | Status: DC | PRN
  Administered 2017-11-15: 125 mg via INTRAVENOUS

## 2017-11-15 MED ORDER — ACETAMINOPHEN 325 MG PO TABS: 325.0000 mg | ORAL_TABLET | ORAL | Status: DC | PRN

## 2017-11-15 MED ORDER — ONDANSETRON HCL 4 MG/2ML IJ SOLN
INTRAMUSCULAR | Status: AC
  Filled 2017-11-15: qty 2

## 2017-11-15 MED ORDER — ACETAMINOPHEN 10 MG/ML IV SOLN
INTRAVENOUS | Status: AC
  Filled 2017-11-15: qty 100

## 2017-11-15 MED ORDER — IOPAMIDOL (ISOVUE-300) INJECTION 61%
30.0000 mL | Freq: Once | INTRAVENOUS | Status: AC | PRN
  Administered 2017-11-15: 30 mL via ORAL

## 2017-11-15 MED ORDER — KETOROLAC TROMETHAMINE 30 MG/ML IJ SOLN
INTRAMUSCULAR | Status: DC | PRN
  Administered 2017-11-15: 30 mg via INTRAVENOUS

## 2017-11-15 MED ORDER — MORPHINE SULFATE (PF) 4 MG/ML IV SOLN
4.0000 mg | Freq: Once | INTRAVENOUS | Status: AC
Start: 2017-11-15 — End: 2017-11-15
  Administered 2017-11-15: 4 mg via INTRAVENOUS
  Filled 2017-11-15: qty 1

## 2017-11-15 MED ORDER — CHLORHEXIDINE GLUCONATE CLOTH 2 % EX PADS: 6.0000 | MEDICATED_PAD | Freq: Once | CUTANEOUS | Status: DC

## 2017-11-15 MED ORDER — SODIUM CHLORIDE 0.9 % IV BOLUS: 1000.0000 mL | Freq: Once | INTRAVENOUS | Status: DC

## 2017-11-15 MED ORDER — PROMETHAZINE HCL 25 MG/ML IJ SOLN: 6.2500 mg | INTRAMUSCULAR | Status: DC | PRN

## 2017-11-15 MED ORDER — SODIUM CHLORIDE 0.9 % IV BOLUS
1000.0000 mL | Freq: Once | INTRAVENOUS | Status: AC
  Administered 2017-11-15: 1000 mL via INTRAVENOUS

## 2017-11-15 MED ORDER — ONDANSETRON HCL 4 MG/2ML IJ SOLN
INTRAMUSCULAR | Status: DC | PRN
  Administered 2017-11-15: 4 mg via INTRAVENOUS

## 2017-11-15 MED ORDER — PROPOFOL 10 MG/ML IV BOLUS
INTRAVENOUS | Status: DC | PRN
  Administered 2017-11-15: 150 mg via INTRAVENOUS

## 2017-11-15 MED ORDER — SUGAMMADEX SODIUM 200 MG/2ML IV SOLN
INTRAVENOUS | Status: AC
  Filled 2017-11-15: qty 2

## 2017-11-15 MED ORDER — MEPERIDINE HCL 50 MG/ML IJ SOLN: 6.2500 mg | INTRAMUSCULAR | Status: DC | PRN

## 2017-11-15 MED ORDER — ROCURONIUM BROMIDE 50 MG/5ML IV SOLN
INTRAVENOUS | Status: AC
  Filled 2017-11-15: qty 1

## 2017-11-15 MED ORDER — HYDROCODONE-ACETAMINOPHEN 7.5-325 MG PO TABS
ORAL_TABLET | ORAL | Status: AC
  Administered 2017-11-15: 1 via ORAL
  Filled 2017-11-15: qty 1

## 2017-11-15 MED ORDER — FENTANYL CITRATE (PF) 100 MCG/2ML IJ SOLN: 25.0000 ug | INTRAMUSCULAR | Status: DC | PRN

## 2017-11-15 MED ORDER — LIDOCAINE HCL (CARDIAC) PF 100 MG/5ML IV SOSY
PREFILLED_SYRINGE | INTRAVENOUS | Status: DC | PRN
  Administered 2017-11-15: 60 mg via INTRAVENOUS

## 2017-11-15 MED ORDER — DEXAMETHASONE SODIUM PHOSPHATE 10 MG/ML IJ SOLN
INTRAMUSCULAR | Status: DC | PRN
  Administered 2017-11-15: 10 mg via INTRAVENOUS

## 2017-11-15 MED ORDER — LIDOCAINE HCL (PF) 2 % IJ SOLN
INTRAMUSCULAR | Status: AC
  Filled 2017-11-15: qty 10

## 2017-11-15 MED ORDER — LACTATED RINGERS IV SOLN
INTRAVENOUS | Status: DC
  Administered 2017-11-15: 13:00:00 via INTRAVENOUS

## 2017-11-15 MED ORDER — KETOROLAC TROMETHAMINE 30 MG/ML IJ SOLN
INTRAMUSCULAR | Status: AC
  Filled 2017-11-15: qty 1

## 2017-11-15 MED ORDER — ONDANSETRON HCL 4 MG/2ML IJ SOLN
4.0000 mg | Freq: Once | INTRAMUSCULAR | Status: AC
  Administered 2017-11-15: 4 mg via INTRAVENOUS
  Filled 2017-11-15: qty 2

## 2017-11-15 MED ORDER — DEXAMETHASONE SODIUM PHOSPHATE 10 MG/ML IJ SOLN
INTRAMUSCULAR | Status: AC
  Filled 2017-11-15: qty 1

## 2017-11-15 MED ORDER — PROPOFOL 10 MG/ML IV BOLUS
INTRAVENOUS | Status: AC
  Filled 2017-11-15: qty 20

## 2017-11-15 MED ORDER — HYDROCODONE-ACETAMINOPHEN 5-325 MG PO TABS: 1.0000 | ORAL_TABLET | ORAL | 0 refills | Status: DC | PRN

## 2017-11-15 MED ORDER — HYDROCODONE-ACETAMINOPHEN 7.5-325 MG PO TABS
1.0000 | ORAL_TABLET | Freq: Once | ORAL | Status: AC | PRN
  Administered 2017-11-15: 1 via ORAL
  Filled 2017-11-15: qty 1

## 2017-11-15 MED ORDER — FENTANYL CITRATE (PF) 100 MCG/2ML IJ SOLN
INTRAMUSCULAR | Status: AC
  Filled 2017-11-15: qty 2

## 2017-11-15 MED ORDER — ROCURONIUM BROMIDE 100 MG/10ML IV SOLN
INTRAVENOUS | Status: DC | PRN
  Administered 2017-11-15: 40 mg via INTRAVENOUS

## 2017-11-15 MED ORDER — CEFOTETAN DISODIUM 2 G IJ SOLR
2.0000 g | Freq: Two times a day (BID) | INTRAMUSCULAR | Status: DC
  Administered 2017-11-15: 2 g via INTRAVENOUS
  Filled 2017-11-15 (×2): qty 2

## 2017-11-15 MED ORDER — FENTANYL CITRATE (PF) 100 MCG/2ML IJ SOLN
INTRAMUSCULAR | Status: DC | PRN
  Administered 2017-11-15 (×2): 50 ug via INTRAVENOUS

## 2017-11-15 SURGICAL SUPPLY — 36 items
APPLIER CLIP 5 13 M/L LIGAMAX5 (MISCELLANEOUS) ×3
BLADE CLIPPER SURG (BLADE) ×3 IMPLANT
CANISTER SUCT 1200ML W/VALVE (MISCELLANEOUS) ×3 IMPLANT
CHLORAPREP W/TINT 26ML (MISCELLANEOUS) ×3 IMPLANT
CLIP APPLIE 5 13 M/L LIGAMAX5 (MISCELLANEOUS) ×1 IMPLANT
CUTTER FLEX LINEAR 45M (STAPLE) ×3 IMPLANT
DERMABOND ADVANCED (GAUZE/BANDAGES/DRESSINGS) ×2
DERMABOND ADVANCED .7 DNX12 (GAUZE/BANDAGES/DRESSINGS) ×1 IMPLANT
ELECT CAUTERY BLADE 6.4 (BLADE) ×3 IMPLANT
ELECT REM PT RETURN 9FT ADLT (ELECTROSURGICAL) ×3
ELECTRODE REM PT RTRN 9FT ADLT (ELECTROSURGICAL) ×1 IMPLANT
GLOVE BIO SURGEON STRL SZ7 (GLOVE) ×3 IMPLANT
GOWN STRL REUS W/ TWL LRG LVL3 (GOWN DISPOSABLE) ×2 IMPLANT
GOWN STRL REUS W/TWL LRG LVL3 (GOWN DISPOSABLE) ×4
IRRIGATION STRYKERFLOW (MISCELLANEOUS) ×1 IMPLANT
IRRIGATOR STRYKERFLOW (MISCELLANEOUS) ×3
IV NS 1000ML (IV SOLUTION) ×2
IV NS 1000ML BAXH (IV SOLUTION) ×1 IMPLANT
NEEDLE HYPO 22GX1.5 SAFETY (NEEDLE) ×3 IMPLANT
NS IRRIG 500ML POUR BTL (IV SOLUTION) ×3 IMPLANT
PACK LAP CHOLECYSTECTOMY (MISCELLANEOUS) ×3 IMPLANT
PENCIL ELECTRO HAND CTR (MISCELLANEOUS) ×3 IMPLANT
POUCH SPECIMEN RETRIEVAL 10MM (ENDOMECHANICALS) ×3 IMPLANT
RELOAD 45 VASCULAR/THIN (ENDOMECHANICALS) ×3 IMPLANT
RELOAD STAPLE TA45 3.5 REG BLU (ENDOMECHANICALS) ×3 IMPLANT
SCISSORS METZENBAUM CVD 33 (INSTRUMENTS) ×3 IMPLANT
SHEARS HARMONIC ACE PLUS 36CM (ENDOMECHANICALS) ×3 IMPLANT
SLEEVE ENDOPATH XCEL 5M (ENDOMECHANICALS) ×3 IMPLANT
SPONGE LAP 18X18 RF (DISPOSABLE) ×3 IMPLANT
SUT MNCRL AB 4-0 PS2 18 (SUTURE) ×3 IMPLANT
SUT VICRYL 0 AB UR-6 (SUTURE) ×6 IMPLANT
SYR 20CC LL (SYRINGE) ×3 IMPLANT
TRAY FOLEY MTR SLVR 16FR STAT (SET/KITS/TRAYS/PACK) ×3 IMPLANT
TROCAR XCEL BLUNT TIP 100MML (ENDOMECHANICALS) ×3 IMPLANT
TROCAR XCEL NON-BLD 5MMX100MML (ENDOMECHANICALS) ×6 IMPLANT
TUBING INSUF HEATED (TUBING) ×3 IMPLANT

## 2017-11-15 NOTE — Anesthesia Post-op Follow-up Note (Signed)
Anesthesia QCDR form completed.        

## 2017-11-15 NOTE — Anesthesia Preprocedure Evaluation (Addendum)
Anesthesia Evaluation  Patient identified by MRN, date of birth, ID band Patient awake    Reviewed: Allergy & Precautions, H&P , NPO status , reviewed documented beta blocker date and time   Airway Mallampati: II  TM Distance: >3 FB Neck ROM: full    Dental  (+) Teeth Intact   Pulmonary    Pulmonary exam normal        Cardiovascular Normal cardiovascular exam     Neuro/Psych    GI/Hepatic   Endo/Other    Renal/GU      Musculoskeletal   Abdominal   Peds  Hematology   Anesthesia Other Findings Past Medical History: No date: Ovarian cyst History reviewed. No pertinent surgical history. BMI    Body Mass Index:  23.17 kg/m     Reproductive/Obstetrics                            Anesthesia Physical Anesthesia Plan  ASA: I  Anesthesia Plan: General ETT   Post-op Pain Management:    Induction: Intravenous  PONV Risk Score and Plan: 4 or greater and Ondansetron, Treatment may vary due to age or medical condition, Midazolam, Dexamethasone and Metaclopromide  Airway Management Planned: Oral ETT  Additional Equipment:   Intra-op Plan:   Post-operative Plan: Extubation in OR  Informed Consent: I have reviewed the patients History and Physical, chart, labs and discussed the procedure including the risks, benefits and alternatives for the proposed anesthesia with the patient or authorized representative who has indicated his/her understanding and acceptance.   Dental Advisory Given  Plan Discussed with: CRNA  Anesthesia Plan Comments:        Anesthesia Quick Evaluation

## 2017-11-15 NOTE — Transfer of Care (Signed)
Immediate Anesthesia Transfer of Care Note  Patient: Karen Zuniga  Procedure(s) Performed: APPENDECTOMY LAPAROSCOPIC (N/A Abdomen)  Patient Location: PACU  Anesthesia Type:General  Level of Consciousness: sedated  Airway & Oxygen Therapy: Patient connected to face mask oxygen  Post-op Assessment: Post -op Vital signs reviewed and stable  Post vital signs: stable  Last Vitals:  Vitals Value Taken Time  BP 100/63 11/15/2017  2:04 PM  Temp    Pulse 99 11/15/2017  2:04 PM  Resp 18 11/15/2017  2:04 PM  SpO2 100 % 11/15/2017  2:04 PM  Vitals shown include unvalidated device data.  Last Pain:  Vitals:   11/15/17 1227  TempSrc: Temporal  PainSc: 6          Complications: No apparent anesthesia complications

## 2017-11-15 NOTE — Discharge Instructions (Signed)
AMBULATORY SURGERY  °DISCHARGE INSTRUCTIONS ° ° °1) The drugs that you were given will stay in your system until tomorrow so for the next 24 hours you should not: ° °A) Drive an automobile °B) Make any legal decisions °C) Drink any alcoholic beverage ° ° °2) You may resume regular meals tomorrow.  Today it is better to start with liquids and gradually work up to solid foods. ° °You may eat anything you prefer, but it is better to start with liquids, then soup and crackers, and gradually work up to solid foods. ° ° °3) Please notify your doctor immediately if you have any unusual bleeding, trouble breathing, redness and pain at the surgery site, drainage, fever, or pain not relieved by medication. ° ° ° °4) Additional Instructions: ° ° ° ° ° ° ° °Please contact your physician with any problems or Same Day Surgery at [phone], Monday through Friday 6 am to 4 pm, or [place] [facility] [place] Main number at [phone]. °

## 2017-11-15 NOTE — ED Triage Notes (Signed)
Mid abd pain radiating across , denies vaginal complaint/ discharge.

## 2017-11-15 NOTE — H&P (Signed)
Patient ID: Karen Zuniga, female   DOB: 1990/10/11, 27 y.o.   MRN: [id number]  HPI Karen Zuniga is a 28 y.o. femaleasked top see in consultation by Dr. Darnelle Catalan. She reports 24 hrs of abdominal pain, sharp and intermittent and moderate in intensity. Worsening w Movement.  She reports that she had had 2 previous episodes one in January and then the other one in March.  This episode is worse.  She developed some nausea and vomiting.  No fevers or chills.  CT scan personally reviewed showing evidence of a dilated appendix questionable mucocele some very mild stranding.  No abscess no perforation no free air.  CBC is completely normal as well as her BMP. She Otherwise very healthy and is able to perform more than 6 METS of activity without any shortness of breath or chest pain.  HPI  Past Medical History:  Diagnosis Date  . Ovarian cyst     History reviewed. No pertinent surgical history.  Family History  Problem Relation Age of Onset  . Cancer Father   . Stroke Father   . Diabetes Father     Social History Social History   Tobacco Use  . Smoking status: Never Smoker  . Smokeless tobacco: Never Used  Substance Use Topics  . Alcohol use: Yes    Alcohol/week: 0.6 oz    Types: 1 Glasses of wine per week  . Drug use: No    Allergies  Allergen Reactions  . Amoxicillin     Has patient had a PCN reaction causing immediate rash, facial/tongue/throat swelling, SOB or lightheadedness with hypotension: Unknown Has patient had a PCN reaction causing severe rash involving mucus membranes or skin necrosis: Unknown Has patient had a PCN reaction that required hospitalization: Unknown Has patient had a PCN reaction occurring within the last 10 years: Unknown If all of the above answers are "NO", then may proceed with Cephalosporin use.    Current Facility-Administered Medications  Medication Dose Route Frequency Provider Last Rate Last Dose  . cefoTEtan (CEFOTAN) 2 g in sodium chloride 0.9  % 100 mL IVPB  2 g Intravenous Q12H Shar Paez F, MD      . sodium chloride 0.9 % bolus 1,000 mL  1,000 mL Intravenous Once Jahmya Onofrio, Merri Ray, MD       No current outpatient medications on file.     Review of Systems Full ROS  was asked and was negative except for the information on the HPI  Physical Exam Blood pressure 118/72, pulse 84, temperature 97.9 F (36.6 C), temperature source Oral, resp. rate 18, height 5\' 4"  (1.626 m), weight 135 lb (61.2 kg), last menstrual period 11/07/2017, SpO2 100 %. CONSTITUTIONAL: NAD EYES: Pupils are equal, round, and reactive to light, Sclera are non-icteric. EARS, NOSE, MOUTH AND THROAT: The oropharynx is clear. The oral mucosa is pink and moist. Hearing is intact to voice. LYMPH NODES:  Lymph nodes in the neck are normal. RESPIRATORY:  Lungs are clear. There is normal respiratory effort, with equal breath sounds bilaterally, and without pathologic use of accessory muscles. CARDIOVASCULAR: Heart is regular without murmurs, gallops, or rubs. GI: The abdomen is  Soft,TTP RLQ, no peritonitis, no scars GU: Rectal deferred.   MUSCULOSKELETAL: Normal muscle strength and tone. No cyanosis or edema.   SKIN: Turgor is good and there are no pathologic skin lesions or ulcers. NEUROLOGIC: Motor and sensation is grossly normal. Cranial nerves are grossly intact. PSYCH:  Oriented to person, place and time. Affect is  normal.  Data Reviewed  I have personally reviewed the patient's imaging, laboratory findings and medical records.    Assessment/Plan 27 year old female with symptoms consistent with acute appendicitis confirmed by CT scan.  With the patient detail about my recommendation for appendectomy.  Given the fact that she has recurrent symptoms and there is a possibility of a mucocele I definitely recommend laparoscopic appendectomy. The risks, benefits, complications, treatment options, and expected outcomes were discussed with the patient. discussed  continuing to the operating room for Laparoscopic Appendectomy.  The possibilities of  bleeding, recurrent infection, perforation of viscus, finding a normal appendix, the need for additional procedures, failure to diagnose a condition, conversion to open procedure and creating a complication requiring transfusion or further operations were discussed. The patient was given the opportunity to ask questions and have them answered.  Patient would like to proceed with Laparoscopic Appendectomy and consent was obtained.  We will start A/bs and give another bolus of crystalloid  Sterling Bigiego Shamar Engelmann, MD FACS General Surgeon 11/15/2017, 12:12 PM

## 2017-11-15 NOTE — Anesthesia Procedure Notes (Signed)
Procedure Name: Intubation Date/Time: 11/15/2017 1:19 PM Performed by: Irving BurtonBachich, Giselle Brutus, CRNA Pre-anesthesia Checklist: Patient identified, Emergency Drugs available, Suction available and Patient being monitored Patient Re-evaluated:Patient Re-evaluated prior to induction Oxygen Delivery Method: Circle system utilized Preoxygenation: Pre-oxygenation with 100% oxygen Induction Type: IV induction Ventilation: Mask ventilation without difficulty Laryngoscope Size: 3 and McGraph Grade View: Grade I Tube type: Oral Tube size: 7.0 mm Number of attempts: 1 Airway Equipment and Method: Stylet and Video-laryngoscopy Placement Confirmation: ETT inserted through vocal cords under direct vision,  positive ETCO2 and breath sounds checked- equal and bilateral Secured at: 21 cm Tube secured with: Tape Dental Injury: Teeth and Oropharynx as per pre-operative assessment

## 2017-11-15 NOTE — ED Provider Notes (Signed)
[facility] Emergency Department Provider Note   ____________________________________________   First MD Initiated Contact with Patient 11/15/17 [phone]     (approximate)  I have reviewed the triage vital signs and the nursing notes.   HISTORY  Chief Complaint Abdominal Pain    HPI Karen Zuniga is a 27 y.o. female who complains of onset of abdominal pain earlier this morning.  Sharp mid abdomen radiates across the whole belly.  She is nauseated and vomited some pain is worsening.  I think she does seems to make it better or worse though.  Is moderately severe.  Past Medical History:  Diagnosis Date  . Ovarian cyst     There are no active problems to display for this patient.   History reviewed. No pertinent surgical history.  Prior to Admission medications   Not on File    Allergies Amoxicillin  Family History  Problem Relation Age of Onset  . Cancer Father   . Stroke Father   . Diabetes Father     Social History Social History   Tobacco Use  . Smoking status: Never Smoker  . Smokeless tobacco: Never Used  Substance Use Topics  . Alcohol use: Yes    Alcohol/week: 0.6 oz    Types: 1 Glasses of wine per week  . Drug use: No    Review of Systems  Constitutional: No fever/chills Eyes: No visual changes. ENT: No sore throat. Cardiovascular: Denies chest pain. Respiratory: Denies shortness of breath. Gastrointestinal:  abdominal pain.   nausea,  vomiting.  No diarrhea.  No constipation. Genitourinary: Negative for dysuria. Musculoskeletal: Negative for back pain. Skin: Negative for rash. Neurological: Negative for headaches, focal weakness or   ____________________________________________   PHYSICAL EXAM:  VITAL SIGNS: ED Triage Vitals  Enc Vitals Group     BP 11/15/17 0611 126/79     Pulse Rate 11/15/17 0611 90     Resp 11/15/17 0611 18     Temp 11/15/17 0611 97.9 F (36.6 C)     Temp Source 11/15/17 0611 Oral    SpO2 11/15/17 0611 100 %     Weight 11/15/17 0612 135 lb (61.2 kg)     Height 11/15/17 0612 5\' 4"  (1.626 m)     Head Circumference --      Peak Flow --      Pain Score 11/15/17 0612 9     Pain Loc --      Pain Edu? --      Excl. in GC? --     Constitutional: Alert and oriented. Well appearing and in no acute distress. Eyes: Conjunctivae are normal.  Head: Atraumatic. Nose: No congestion/rhinnorhea. Mouth/Throat: Mucous membranes are moist.  Oropharynx non-erythematous. Neck: No stridor.   Cardiovascular: Normal rate, regular rhythm. Grossly normal heart sounds.  Good peripheral circulation. Respiratory: Normal respiratory effort.  No retractions. Lungs CTAB. Gastrointestinal: Soft mildly diffusely tender to palpation and percussion. No distention. No abdominal bruits. No CVA tenderness.  No suprapubic tenderness Musculoskeletal: No lower extremity tenderness nor edema.  No joint effusions. Neurologic:  Normal speech and language. No gross focal neurologic deficits are appreciated. No gait instability. Skin:  Skin is warm, dry and intact. No rash noted. Psychiatric: Mood and affect are normal. Speech and behavior are normal.  ____________________________________________   LABS (all labs ordered are listed, but only abnormal results are displayed)  Labs Reviewed  COMPREHENSIVE METABOLIC PANEL - Abnormal; Notable for the following components:      Result Value  Glucose, Bld 115 (*)    All other components within normal limits  URINALYSIS, COMPLETE (UACMP) WITH MICROSCOPIC - Abnormal; Notable for the following components:   Color, Urine YELLOW (*)    APPearance CLEAR (*)    Ketones, ur 20 (*)    All other components within normal limits  LIPASE, BLOOD  CBC  POC URINE PREG, ED  POCT PREGNANCY, URINE   ____________________________________________  EKG   ____________________________________________  RADIOLOGY  ED MD interpretation:    Official radiology  report(s): Ct Abdomen Pelvis W Contrast  Result Date: 11/15/2017 CLINICAL DATA:  27 year old female with acute abdominal pain and nausea/vomiting for 1 day. EXAM: CT ABDOMEN AND PELVIS WITH CONTRAST TECHNIQUE: Multidetector CT imaging of the abdomen and pelvis was performed using the standard protocol following bolus administration of intravenous contrast. CONTRAST:  75 cc intravenous Isovue 370 COMPARISON:  03/09/2013 CT FINDINGS: Lower chest: No acute abnormality. Hepatobiliary: The liver and gallbladder are unremarkable. No biliary dilatation. Pancreas: Unremarkable Spleen: Unremarkable Adrenals/Urinary Tract: The kidneys, adrenal glands and bladder are unremarkable. Stomach/Bowel: The mid-distal appendix is distended up to 8 mm containing fluid attenuation, new since 2014. However the appendiceal walls are not thickened and no hyperenhancement is noted. No adjacent inflammatory changes are identified. No other bowel abnormalities are identified. There is no evidence of bowel obstruction or pneumoperitoneum. Vascular/Lymphatic: No significant vascular findings are present. No enlarged abdominal or pelvic lymph nodes. Reproductive: Uterus and bilateral adnexa are unremarkable. Other: No ascites, abscess or abdominal wall hernia. Musculoskeletal: No acute or significant osseous findings. IMPRESSION: 1. New mildly distended mid-distal appendix containing low-density material/fluid. This is equivocal for early appendicitis as no definite wall enhancement, appendiceal wall thickening or definite adjacent inflammation noted. Alternatively, this could represent a mucocele. Clinical follow-up recommended. 2. No other significant abnormalities. Electronically Signed   By: Harmon PierJeffrey  Hu M.D.   On: 11/15/2017 09:40    ____________________________________________   PROCEDURES  Procedure(s) performed:   Procedures  Critical Care performed:   ____________________________________________   INITIAL IMPRESSION /  ASSESSMENT AND PLAN / ED COURSE   Patient with right lower quadrant pain possible appendicitis appendix is suspicious but not definitive for appendicitis discussed with surgery Dr. Elenor LegatoPavone he will admit the patient probably take out her appendix.        ____________________________________________   FINAL CLINICAL IMPRESSION(S) / ED DIAGNOSES  Final diagnoses:  Right lower quadrant abdominal pain     ED Discharge Orders    None       Note:  This document was prepared using Dragon voice recognition software and may include unintentional dictation errors.    Arnaldo NatalMalinda, Aneka Fagerstrom F, MD 11/15/17 1135

## 2017-11-15 NOTE — Op Note (Signed)
laparascopic appendectomy   Karen Zuniga Date of operation:  11/15/2017  Indications: The patient presented with a history of  abdominal pain. Workup has revealed findings consistent with acute appendicitis.  Pre-operative Diagnosis: Acute appendicitis without mention of peritonitis  Post-operative Diagnosis: Same  Surgeon: Karen Bigiego Zen Cedillos, MD, FACS  Anesthesia: General with endotracheal tube  Findings: non perforated appendicitis  Estimated Blood Loss: 10cc         Specimens: appendix         Complications:  none  Procedure Details  The patient was seen again in the preop area. The options of surgery versus observation were reviewed with the patient and/or family. The risks of bleeding, infection, recurrence of symptoms, negative laparoscopy, potential for an open procedure, bowel injury, abscess or infection, were all reviewed as well. The patient was taken to Operating Room, identified as Karen Zuniga and the procedure verified as laparoscopic appendectomy. A Time Out was held and the above information confirmed.  The patient was placed in the supine position and general anesthesia was induced.  Antibiotic prophylaxis was administered and VT E prophylaxis was in place.   The abdomen was prepped and draped in a sterile fashion. An infraumbilical incision was made. A cutdown technique was used to enter the abdominal cavity. Two vicryl stitches were placed on the fascia and a Hasson trocar inserted. Pneumoperitoneum obtained. Two 5 mm ports were placed under direct visualization.   The appendix was identified and found to be acutely inflamed  The appendix was carefully dissected. The mesoappendix was divided withHarmonic scalpel. The base of the appendix was dissected out and divided with a standard load Endo GIA.The appendix was placed in a Endo Catch bag and removed via the Hasson port. The right lower quadrant and pelvis was then irrigated with  normal saline which was aspirated.  Inspection  failed to identify any additional bleeding and there were no signs of bowel injury. Again the right lower quadrant was inspected there was no sign of bleeding or bowel injury therefore pneumoperitoneum was released, all ports were removed.  The umbilical fascia was closed with 0 Vicryl interrupted sutures and the skin incisions were approximated with subcuticular 4-0 Monocryl. Dermabond was placed The patient tolerated the procedure well, there were no complications. The sponge lap and needle count were correct at the end of the procedure.  The patient was taken to the recovery room in stable condition to be admitted for continued care.    Karen Bigiego Krishiv Sandler, MD FACS

## 2017-11-15 NOTE — ED Notes (Signed)
OR staff here to take pt. Upon transfer pt alert & oriented x4. ABCs intact. RR even and unlabored. Color WNL. NAD. All belongings/valuables given to patients visitor at bedside.

## 2017-11-16 NOTE — Anesthesia Postprocedure Evaluation (Signed)
Anesthesia Post Note  Patient: [name]  Procedure(s) Performed: APPENDECTOMY LAPAROSCOPIC (N/A Abdomen)  Patient location during evaluation: PACU Anesthesia Type: General Level of consciousness: awake and alert Pain management: pain level controlled Vital Signs Assessment: post-procedure vital signs reviewed and stable Respiratory status: spontaneous breathing, nonlabored ventilation, respiratory function stable and patient connected to nasal cannula oxygen Cardiovascular status: blood pressure returned to baseline and stable Postop Assessment: no apparent nausea or vomiting Anesthetic complications: no     Last Vitals:  Vitals:   11/15/17 1441 11/15/17 1527  BP: 108/67 105/60  Pulse: 88 85  Resp: 18 18  Temp:  36.5 C  SpO2: 100% 100%    Last Pain:  Vitals:   11/15/17 1527  TempSrc: Temporal  PainSc: 4                  Elizibeth Breau Garry Heater Jashawn Floyd

## 2017-11-17 LAB — SURGICAL PATHOLOGY

## 2017-11-22 ENCOUNTER — Encounter: Payer: Self-pay | Admitting: Surgery

## 2017-11-22 ENCOUNTER — Ambulatory Visit (INDEPENDENT_AMBULATORY_CARE_PROVIDER_SITE_OTHER): Payer: BLUE CROSS/BLUE SHIELD | Admitting: Surgery

## 2017-11-22 VITALS — BP 104/70 | HR 82 | Ht 64.0 in | Wt 136.0 lb

## 2017-11-22 DIAGNOSIS — Z09 Encounter for follow-up examination after completed treatment for conditions other than malignant neoplasm: Secondary | ICD-10-CM

## 2017-11-22 NOTE — Patient Instructions (Signed)

## 2017-11-24 NOTE — Progress Notes (Signed)
S/p lap appy Doing well No complaints  PE NAD Abd: soft, nt. Incisions c/d/i, no infection  A/P Doing  Well No heavy lifting RTC prn Path d/w pt

## 2018-03-29 ENCOUNTER — Encounter: Payer: Self-pay | Admitting: Obstetrics and Gynecology

## 2018-03-29 ENCOUNTER — Ambulatory Visit (INDEPENDENT_AMBULATORY_CARE_PROVIDER_SITE_OTHER): Payer: BLUE CROSS/BLUE SHIELD | Admitting: Obstetrics and Gynecology

## 2018-03-29 VITALS — BP 134/84 | HR 108 | Ht 64.0 in | Wt 135.5 lb

## 2018-03-29 DIAGNOSIS — Z23 Encounter for immunization: Secondary | ICD-10-CM

## 2018-03-29 DIAGNOSIS — Z01419 Encounter for gynecological examination (general) (routine) without abnormal findings: Secondary | ICD-10-CM | POA: Diagnosis not present

## 2018-03-29 NOTE — Progress Notes (Signed)
  Subjective:     Karen FeltyKaitlyn N Zuniga is a married white 27 y.o. female and is here for a comprehensive physical exam. Working FT at [employer] is sexually active with spouse.The patient reports no problems.  Social History   Socioeconomic History  . Marital status: Single    Spouse name: Not on file  . Number of children: Not on file  . Years of education: Not on file  . Highest education level: Not on file  Occupational History  . Not on file  Social Needs  . Financial resource strain: Not on file  . Food insecurity:    Worry: Not on file    Inability: Not on file  . Transportation needs:    Medical: Not on file    Non-medical: Not on file  Tobacco Use  . Smoking status: Never Smoker  . Smokeless tobacco: Never Used  Substance and Sexual Activity  . Alcohol use: Yes    Alcohol/week: 1.0 standard drinks    Types: 1 Glasses of wine per week  . Drug use: No  . Sexual activity: Yes    Birth control/protection: Condom  Lifestyle  . Physical activity:    Days per week: Not on file    Minutes per session: Not on file  . Stress: Not on file  Relationships  . Social connections:    Talks on phone: Not on file    Gets together: Not on file    Attends religious service: Not on file    Active member of club or organization: Not on file    Attends meetings of clubs or organizations: Not on file    Relationship status: Not on file  . Intimate partner violence:    Fear of current or ex partner: Not on file    Emotionally abused: Not on file    Physically abused: Not on file    Forced sexual activity: Not on file  Other Topics Concern  . Not on file  Social History Narrative  . Not on file   Health Maintenance  Topic Date Due  . HIV Screening  07/22/2007  . TETANUS/TDAP  07/22/2011  . PAP-Cervical Cytology Screening  07/21/2013  . INFLUENZA VACCINE  11/16/2017  . PAP SMEAR-Modifier  03/23/2020    The following portions of the patient's history were reviewed and updated as  appropriate: allergies, current medications, past family history, past medical history, past social history, past surgical history and problem list.  Review of Systems A comprehensive review of systems was negative.   Objective:    General appearance: alert, cooperative and appears stated age Neck: no adenopathy, no carotid bruit, no JVD, supple, symmetrical, trachea midline and thyroid not enlarged, symmetric, no tenderness/mass/nodules Lungs: clear to auscultation bilaterally Breasts: normal appearance, no masses or tenderness Heart: regular rate and rhythm, S1, S2 normal, no murmur, click, rub or gallop Abdomen: soft, non-tender; bowel sounds normal; no masses,  no organomegaly Pelvic: cervix normal in appearance, external genitalia normal, no adnexal masses or tenderness, no cervical motion tenderness, rectovaginal septum normal, uterus normal size, shape, and consistency and vagina normal without discharge    Assessment:    Healthy female exam.  Needs flu vaccine Preconception counseling     Plan:  Flu vaccine given RTC 1 year or as needed.   Winda Summerall Shmabley,CNM   See After Visit Summary for Counseling Recommendations

## 2018-03-29 NOTE — Patient Instructions (Signed)
Preventive Care 18-39 Years, Female Preventive care refers to lifestyle choices and visits with your health care provider that can promote health and wellness. What does preventive care include?  A yearly physical exam. This is also called an annual well check.  Dental exams once or twice a year.  Routine eye exams. Ask your health care provider how often you should have your eyes checked.  Personal lifestyle choices, including: ? Daily care of your teeth and gums. ? Regular physical activity. ? Eating a healthy diet. ? Avoiding tobacco and drug use. ? Limiting alcohol use. ? Practicing safe sex. ? Taking vitamin and mineral supplements as recommended by your health care provider. What happens during an annual well check? The services and screenings done by your health care provider during your annual well check will depend on your age, overall health, lifestyle risk factors, and family history of disease. Counseling Your health care provider may ask you questions about your:  Alcohol use.  Tobacco use.  Drug use.  Emotional well-being.  Home and relationship well-being.  Sexual activity.  Eating habits.  Work and work Statistician.  Method of birth control.  Menstrual cycle.  Pregnancy history.  Screening You may have the following tests or measurements:  Height, weight, and BMI.  Diabetes screening. This is done by checking your blood sugar (glucose) after you have not eaten for a while (fasting).  Blood pressure.  Lipid and cholesterol levels. These may be checked every 5 years starting at age 38.  Skin check.  Hepatitis C blood test.  Hepatitis B blood test.  Sexually transmitted disease (STD) testing.  BRCA-related cancer screening. This may be done if you have a family history of breast, ovarian, tubal, or peritoneal cancers.  Pelvic exam and Pap test. This may be done every 3 years starting at age 38. Starting at age 30, this may be done  every 5 years if you have a Pap test in combination with an HPV test.  Discuss your test results, treatment options, and if necessary, the need for more tests with your health care provider. Vaccines Your health care provider may recommend certain vaccines, such as:  Influenza vaccine. This is recommended every year.  Tetanus, diphtheria, and acellular pertussis (Tdap, Td) vaccine. You may need a Td booster every 10 years.  Varicella vaccine. You may need this if you have not been vaccinated.  HPV vaccine. If you are 39 or younger, you may need three doses over 6 months.  Measles, mumps, and rubella (MMR) vaccine. You may need at least one dose of MMR. You may also need a second dose.  Pneumococcal 13-valent conjugate (PCV13) vaccine. You may need this if you have certain conditions and were not previously vaccinated.  Pneumococcal polysaccharide (PPSV23) vaccine. You may need one or two doses if you smoke cigarettes or if you have certain conditions.  Meningococcal vaccine. One dose is recommended if you are age 68-21 years and a first-year college student living in a residence hall, or if you have one of several medical conditions. You may also need additional booster doses.  Hepatitis A vaccine. You may need this if you have certain conditions or if you travel or work in places where you may be exposed to hepatitis A.  Hepatitis B vaccine. You may need this if you have certain conditions or if you travel or work in places where you may be exposed to hepatitis B.  Haemophilus influenzae type b (Hib) vaccine. You may need this  if you have certain risk factors.  Talk to your health care provider about which screenings and vaccines you need and how often you need them. This information is not intended to replace advice given to you by your health care provider. Make sure you discuss any questions you have with your health care provider. Document Released: 05/31/2001 Document Revised:  12/23/2015 Document Reviewed: 02/03/2015 Elsevier Interactive Patient Education  2018 [employer]  

## 2018-08-27 IMAGING — CT CT ABD-PELV W/ CM
2 of 4 series · 16 of 46 positions shown, 18 images · IV contrast (APPLIED)
Comparison: 03/09/2013 CT

CLINICAL DATA: 27-year-old female with acute abdominal pain and
nausea/vomiting for 1 day.

EXAM:
CT ABDOMEN AND PELVIS WITH CONTRAST
TECHNIQUE: Multidetector CT imaging of the abdomen and pelvis was performed
using the standard protocol following bolus administration of
intravenous contrast.
CONTRAST:  75 cc intravenous Isovue 370

[Series 2: routine abd/pel with · axial · 0.62mm/px · z∈[-1226,-801]mm · 13 of 93 slices shown, 15 images]
[im 4/93  soft-tissue]
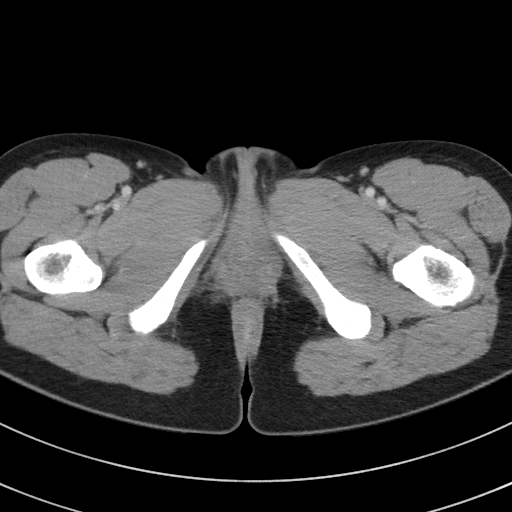
[im 4/93  bone]
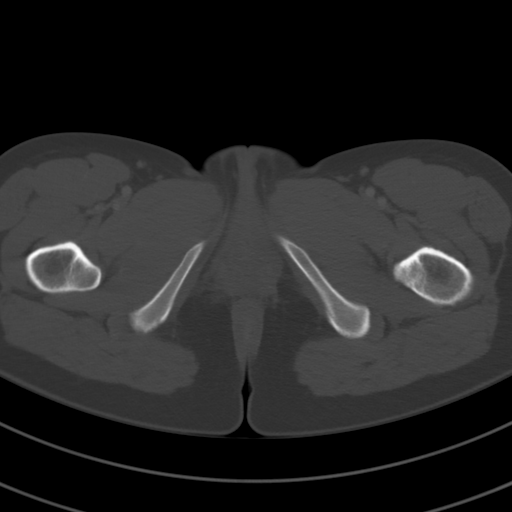
[im 12/93  soft-tissue]
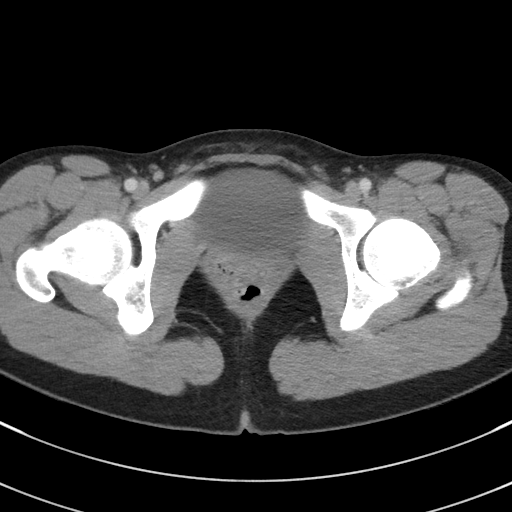
[im 19/93  soft-tissue]
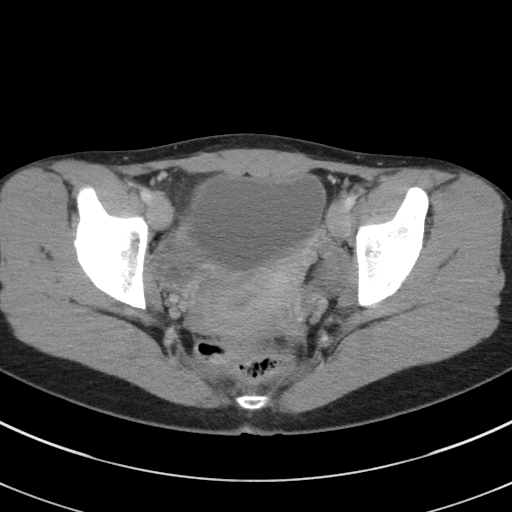
[im 26/93  soft-tissue]
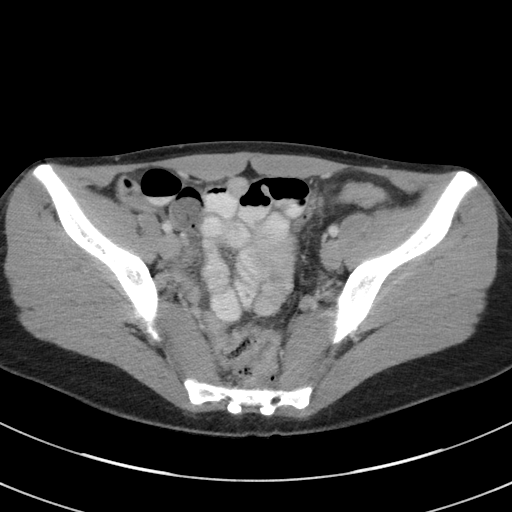
[im 34/93  soft-tissue]
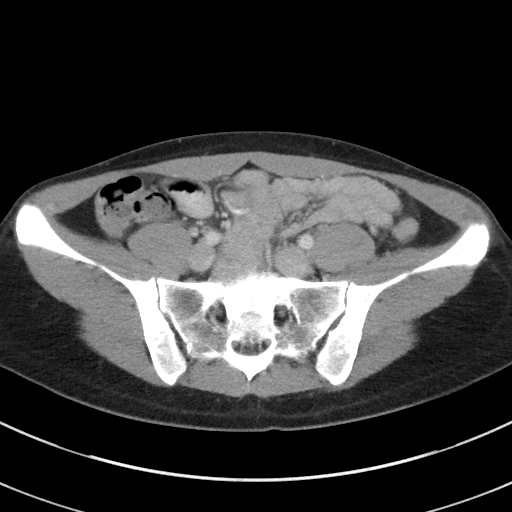
[im 41/93  soft-tissue]
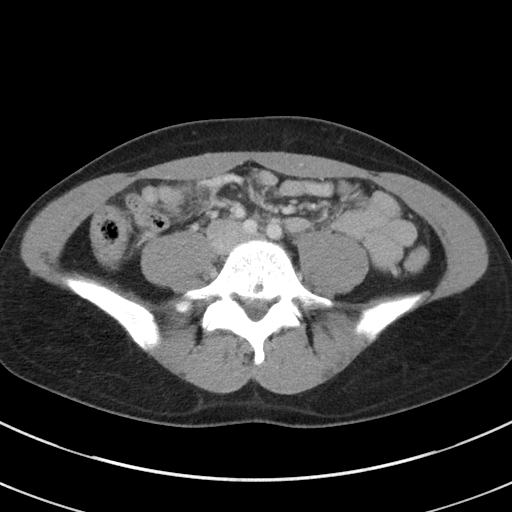
[im 48/93  soft-tissue]
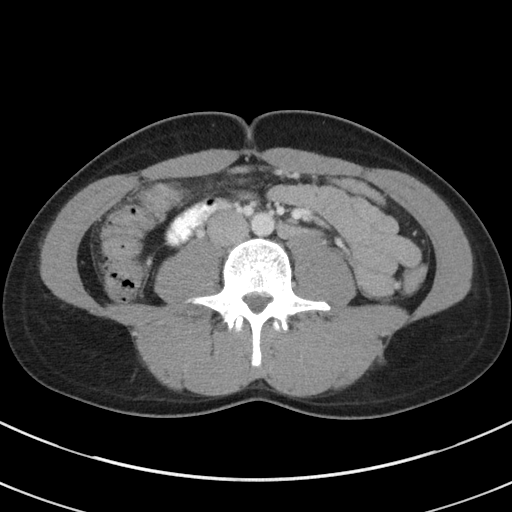
[im 52/93  soft-tissue]
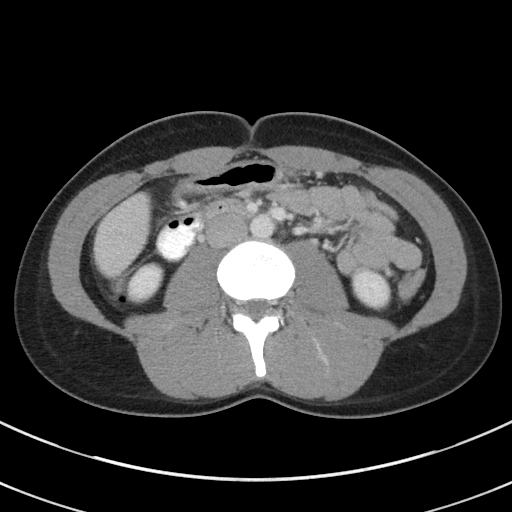
[im 59/93  soft-tissue]
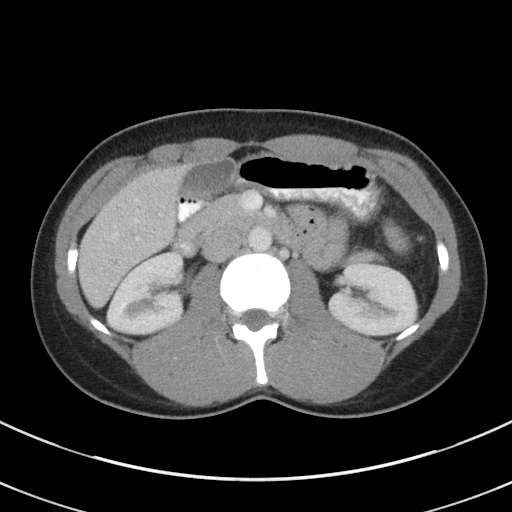
[im 59/93  bone]
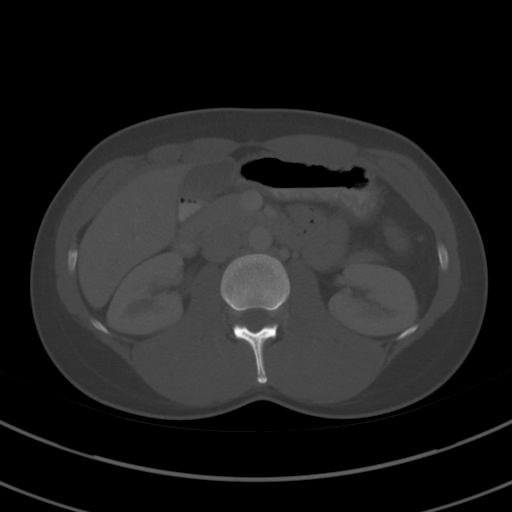
[im 67/93  soft-tissue]
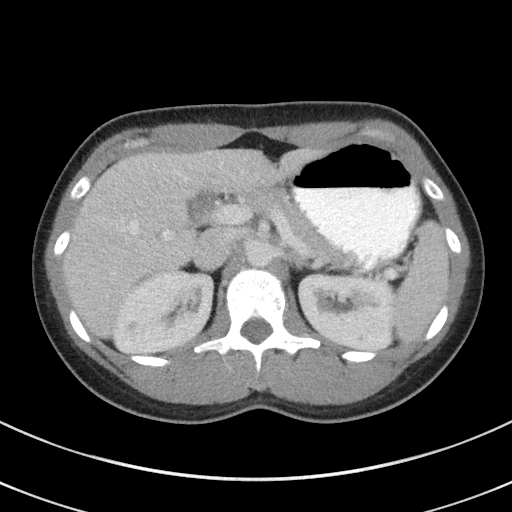
[im 74/93  soft-tissue]
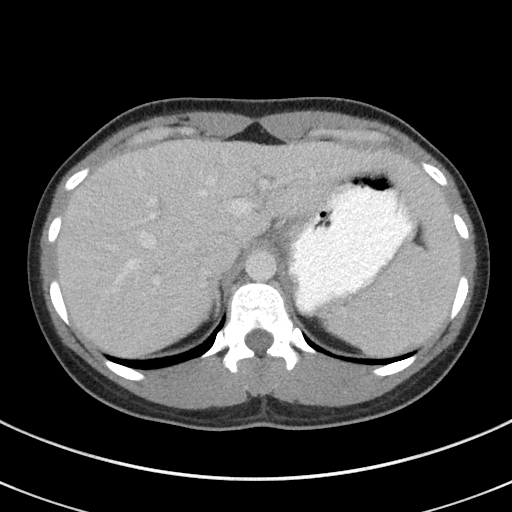
[im 81/93  soft-tissue]
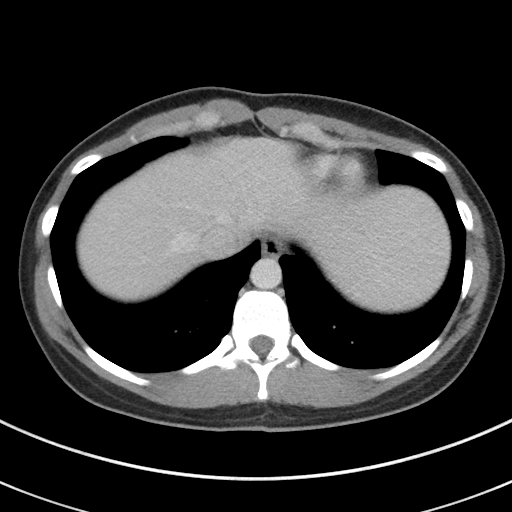
[im 89/93  soft-tissue]
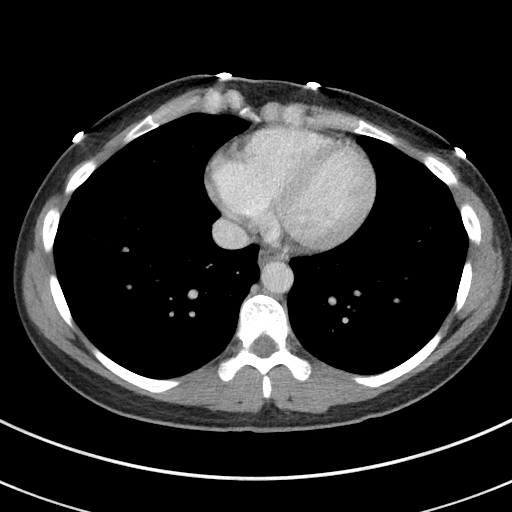

[Series 5: coronal st · coronal · 0.67mm/px · 3 of 76 slices shown]
[im 26/76  soft-tissue]
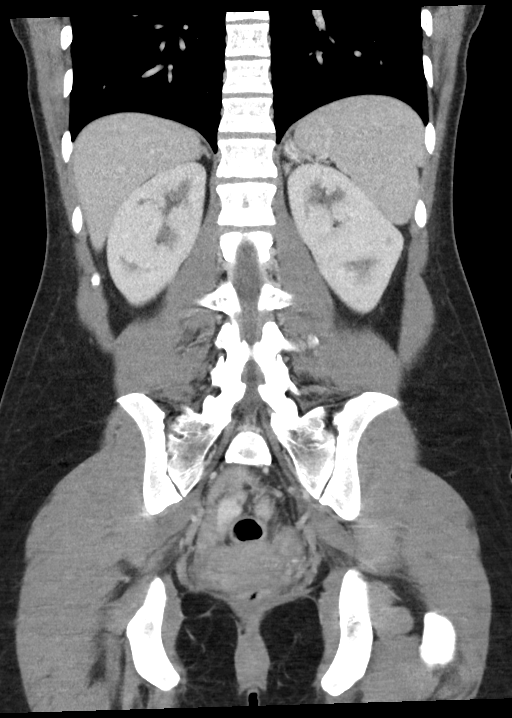
[im 34/76  soft-tissue]
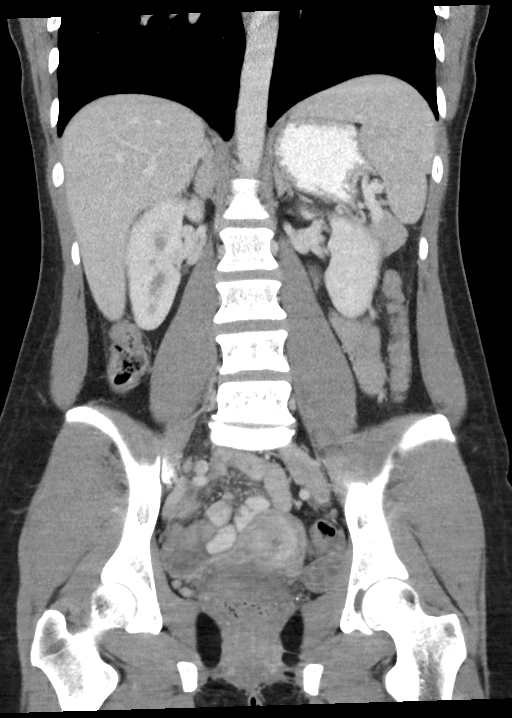
[im 42/76  soft-tissue]
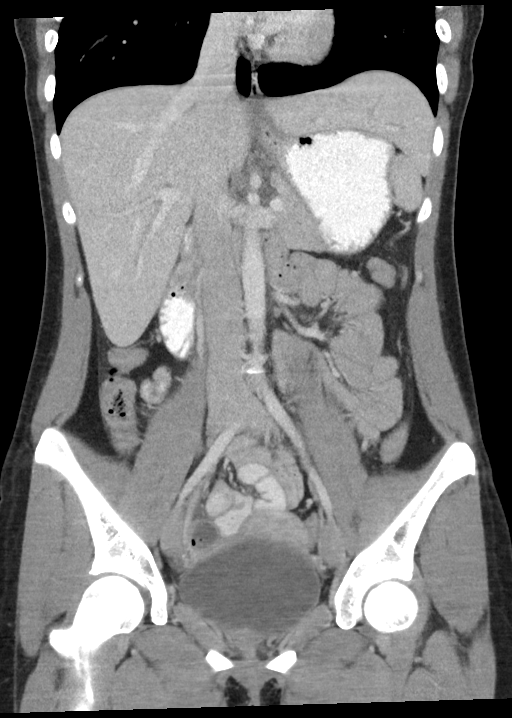

[16 of 46 positions shown; findings below may reference images not displayed]

FINDINGS: Lower chest: No acute abnormality.

Hepatobiliary: The liver and gallbladder are unremarkable. No
biliary dilatation.

Pancreas: Unremarkable

Spleen: Unremarkable

Adrenals/Urinary Tract: The kidneys, adrenal glands and bladder are
unremarkable.

Stomach/Bowel: The mid-distal appendix is distended up to 8 mm
containing fluid attenuation, new since 7597. However the
appendiceal walls are not thickened and no hyperenhancement is
noted. No adjacent inflammatory changes are identified.

No other bowel abnormalities are identified. There is no evidence of
bowel obstruction or pneumoperitoneum.

Vascular/Lymphatic: No significant vascular findings are present. No
enlarged abdominal or pelvic lymph nodes.

Reproductive: Uterus and bilateral adnexa are unremarkable.

Other: No ascites, abscess or abdominal wall hernia.

Musculoskeletal: No acute or significant osseous findings.
IMPRESSION: 1. New mildly distended mid-distal appendix containing low-density
material/fluid. This is equivocal for early appendicitis as no
definite wall enhancement, appendiceal wall thickening or definite
adjacent inflammation noted. Alternatively, this could represent a
mucocele. Clinical follow-up recommended.
2. No other significant abnormalities.

## 2018-11-28 ENCOUNTER — Ambulatory Visit (INDEPENDENT_AMBULATORY_CARE_PROVIDER_SITE_OTHER): Payer: BC Managed Care – PPO | Admitting: Obstetrics and Gynecology

## 2018-11-28 ENCOUNTER — Encounter: Payer: Self-pay | Admitting: Obstetrics and Gynecology

## 2018-11-28 ENCOUNTER — Other Ambulatory Visit: Payer: Self-pay

## 2018-11-28 VITALS — BP 118/78 | HR 98 | Ht 64.0 in | Wt 140.2 lb

## 2018-11-28 DIAGNOSIS — Z3169 Encounter for other general counseling and advice on procreation: Secondary | ICD-10-CM

## 2018-11-28 DIAGNOSIS — Z8279 Family history of other congenital malformations, deformations and chromosomal abnormalities: Secondary | ICD-10-CM

## 2018-11-28 NOTE — Progress Notes (Signed)
  Subjective:     Patient ID: Karen Zuniga, female   DOB: 1990-08-28, 28 y.o.   MRN: [id number]  HPI Has been trying on/off for pregnancy since October 2019. Has never been on any hormonal birth control.  Does report increase in acne since started trying. And occasional pelvic pain that typically occurs around ovulation.   Menses are currently lasting 4-5 days with first few days being heavy. Menses are 28-32 days apart.  Has been having sex during fertile times.   Spouse has no other children and takes toprol as needed for an irregular heart rate, does not smoke and is not overweight.    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 Which is reported to have increased risks of miscarriage. Her mother had multiple miscarriages before conceiving her.   Review of Systems  All other systems reviewed and are negative.      Objective:   Physical Exam A&Ox4 Well groomed female in no distress Blood pressure 118/78, pulse 98, height 5\' 4"  (1.626 m), weight 140 lb 3.2 oz (63.6 kg), last menstrual period 11/26/2018. Body mass index is 24.07 kg/m.  PE not indicated.    Assessment:     Infertility counseling    Plan:     Discussed normal findings and reassured that it can commonly take up to a year to achieve pregnancy. Does desires starting a work up for infertility and will have her return on day 20-22 of upcoming cycle for u/s and labs and will follow up accordingly. I will investigate the genetic abnormality to see what risks are associated with it at this time and let her know.  RTC as planned above.   Jorgeluis Gurganus,CNM

## 2018-12-18 ENCOUNTER — Ambulatory Visit (INDEPENDENT_AMBULATORY_CARE_PROVIDER_SITE_OTHER): Payer: BC Managed Care – PPO

## 2018-12-18 ENCOUNTER — Other Ambulatory Visit: Payer: Self-pay

## 2018-12-18 ENCOUNTER — Other Ambulatory Visit: Payer: BC Managed Care – PPO

## 2018-12-18 DIAGNOSIS — Z3169 Encounter for other general counseling and advice on procreation: Secondary | ICD-10-CM | POA: Diagnosis not present

## 2018-12-19 LAB — FSH/LH
FSH: 1.9 m[IU]/mL
LH: 2.7 m[IU]/mL

## 2018-12-19 LAB — PROGESTERONE: Progesterone: 16.7 ng/mL

## 2019-02-20 ENCOUNTER — Encounter: Payer: Self-pay | Admitting: Obstetrics and Gynecology

## 2019-02-20 ENCOUNTER — Ambulatory Visit: Payer: BC Managed Care – PPO | Admitting: Obstetrics and Gynecology

## 2019-02-20 ENCOUNTER — Other Ambulatory Visit: Payer: Self-pay

## 2019-02-20 VITALS — BP 124/85 | HR 94 | Ht 64.0 in | Wt 125.3 lb

## 2019-02-20 DIAGNOSIS — N912 Amenorrhea, unspecified: Secondary | ICD-10-CM

## 2019-02-20 LAB — POCT URINE PREGNANCY: Preg Test, Ur: POSITIVE — AB

## 2019-02-20 NOTE — Progress Notes (Signed)
  Subjective:     Patient ID: Karen Zuniga, female   DOB: Jun 10, 1990, 28 y.o.   MRN: [id number]  HPI Here for pregnancy confirmation, reporting LMP 01/19/2019, which gives [facility] 10/27/2019 & EGA [redacted]w[redacted]d.  Review of Systems     Objective:   Physical Exam A&Ox4 Well groomed female in no distress. Vitals with BMI 02/20/2019 11/28/2018 03/29/2018  Height 5\' 4"  5\' 4"  5\' 4"   Weight 125 lbs 5 oz 140 lbs 3 oz 135 lbs 8 oz  BMI 21.5 20.25 42.70  Systolic 623 762 831  Diastolic 85 78 84  Pulse 94 98 108  UPT+    Assessment:     Missed menses    Plan:     Congratulated on pregnancy and reviewed routine prenatal care. Will return in 3 weeks for viability scan and nurse intake/labs, then 7 weeks for new OB PE with me.    Melody Shambley,CNM

## 2019-02-20 NOTE — Progress Notes (Signed)
Patient here for pregnancy confirmation.  No complaints.  

## 2019-02-20 NOTE — Patient Instructions (Addendum)
Common Medications Safe in Pregnancy  Acne:      Constipation:  Benzoyl Peroxide     Colace  Clindamycin      Dulcolax Suppository  Topica Erythromycin     Fibercon  Salicylic Acid      Metamucil         Miralax AVOID:        Senakot   Accutane    Cough:  Retin-A       Cough Drops  Tetracycline      Phenergan w/ Codeine if Rx  Minocycline      Robitussin (Plain & DM)  Antibiotics:     Crabs/Lice:  Ceclor       RID  Cephalosporins    AVOID:  E-Mycins      Kwell  Keflex  Macrobid/Macrodantin   Diarrhea:  Penicillin      Kao-Pectate  Zithromax      Imodium AD         PUSH FLUIDS AVOID:       Cipro     Fever:  Tetracycline      Tylenol (Regular or Extra  Minocycline       Strength)  Levaquin      Extra Strength-Do not          Exceed 8 tabs/24 hrs Caffeine:        <231m/day (equiv. To 1 cup of coffee or  approx. 3 12 oz sodas)         Gas: Cold/Hayfever:       Gas-X  Benadryl      Mylicon  Claritin       Phazyme  **Claritin-D        Chlor-Trimeton    Headaches:  Dimetapp      ASA-Free Excedrin  Drixoral-Non-Drowsy     Cold Compress  Mucinex (Guaifenasin)     Tylenol (Regular or Extra  Sudafed/Sudafed-12 Hour     Strength)  **Sudafed PE Pseudoephedrine   Tylenol Cold & Sinus     Vicks Vapor Rub  Zyrtec  **AVOID if Problems With Blood Pressure         Heartburn: Avoid lying down for at least 1 hour after meals  Aciphex      Maalox     Rash:  Milk of Magnesia     Benadryl    Mylanta       1% Hydrocortisone Cream  Pepcid  Pepcid Complete   Sleep Aids:  Prevacid      Ambien   Prilosec       Benadryl  Rolaids       Chamomile Tea  Tums (Limit 4/day)     Unisom  Zantac       Tylenol PM         Warm milk-add vanilla or  Hemorrhoids:       Sugar for taste  Anusol/Anusol H.C.  (RX: Analapram 2.5%)  Sugar Substitutes:  Hydrocortisone OTC     Ok in moderation  Preparation H      Tucks        Vaseline lotion applied to tissue with  wiping    Herpes:     Throat:  Acyclovir      Oragel  Famvir  Valtrex     Vaccines:         Flu Shot Leg Cramps:       *Gardasil  Benadryl      Hepatitis A         Hepatitis B Nasal Spray:  Pneumovax  Saline Nasal Spray     Polio Booster         Tetanus Nausea:       Tuberculosis test or PPD  Vitamin B6 25 mg TID   AVOID:    Dramamine      *Gardasil  Emetrol       Live Poliovirus  Ginger Root 250 mg QID    MMR (measles, mumps &  High Complex Carbs @ Bedtime    rebella)  Sea Bands-Accupressure    Varicella (Chickenpox)  Unisom 1/2 tab TID     *No known complications           If received before Pain:         Known pregnancy;   Darvocet       Resume series after  Lortab        Delivery  Percocet    Yeast:   Tramadol      Femstat  Tylenol 3      Gyne-lotrimin  Ultram       Monistat  Vicodin           MISC:         All Sunscreens           Hair Coloring/highlights          Insect Repellant's          (Including DEET)         Mystic Tans Commonly Asked Questions During Pregnancy  Cats: A parasite can be excreted in cat feces.  To avoid exposure you need to have another person empty the little box.  If you must empty the litter box you will need to wear gloves.  Wash your hands after handling your cat.  This parasite can also be found in raw or undercooked meat so this should also be avoided.  Colds, Sore Throats, Flu: Please check your medication sheet to see what you can take for symptoms.  If your symptoms are unrelieved by these medications please call the office.  Dental Work: Most any dental work Investment banker, corporate recommends is permitted.  X-rays should only be taken during the first trimester if absolutely necessary.  Your abdomen should be shielded with a lead apron during all x-rays.  Please notify your provider prior to receiving any x-rays.  Novocaine is fine; gas is not recommended.  If your dentist requires a note from [place] prior to dental work please call the office  and we will provide one for you.  Exercise: Exercise is an important part of staying healthy during your pregnancy.  You may continue most exercises you were accustomed to prior to pregnancy.  Later in your pregnancy you will most likely notice you have difficulty with activities requiring balance like riding a bicycle.  It is important that you listen to your body and avoid activities that put you at a higher risk of falling.  Adequate rest and staying well hydrated are a must!  If you have questions about the safety of specific activities ask your provider.    Exposure to Children with illness: Try to avoid obvious exposure; report any symptoms to [place] when noted,  If you have chicken pos, red measles or mumps, you should be immune to these diseases.   Please do not take any vaccines while pregnant unless you have checked with your OB provider.  Fetal Movement: After 28 weeks we recommend you do "kick counts" twice daily.  Lie or sit down in a calm  quiet environment and count your baby movements "kicks".  You should feel your baby at least 10 times per hour.  If you have not felt 10 kicks within the first hour get up, walk around and have something sweet to eat or drink then repeat for an additional hour.  If count remains less than 10 per hour notify your provider.  Fumigating: Follow your pest control agent's advice as to how long to stay out of your home.  Ventilate the area well before re-entering.  Hemorrhoids:   Most over-the-counter preparations can be used during pregnancy.  Check your medication to see what is safe to use.  It is important to use a stool softener or fiber in your diet and to drink lots of liquids.  If hemorrhoids seem to be getting worse please call the office.   Hot Tubs:  Hot tubs Jacuzzis and saunas are not recommended while pregnant.  These increase your internal body temperature and should be avoided.  Intercourse:  Sexual intercourse is safe during pregnancy as long as  you are comfortable, unless otherwise advised by your provider.  Spotting may occur after intercourse; report any bright red bleeding that is heavier than spotting.  Labor:  If you know that you are in labor, please go to the hospital.  If you are unsure, please call the office and let us help you decide what to do.  Lifting, straining, etc:  If your job requires heavy lifting or straining please check with your provider for any limitations.  Generally, you should not lift items heavier than that you can lift simply with your hands and arms (no back muscles)  Painting:  Paint fumes do not harm your pregnancy, but may make you ill and should be avoided if possible.  Latex or water based paints have less odor than oils.  Use adequate ventilation while painting.  Permanents & Hair Color:  Chemicals in hair dyes are not recommended as they cause increase hair dryness which can increase hair loss during pregnancy.  " Highlighting" and permanents are allowed.  Dye may be absorbed differently and permanents may not hold as well during pregnancy.  Sunbathing:  Use a sunscreen, as skin burns easily during pregnancy.  Drink plenty of fluids; avoid over heating.  Tanning Beds:  Because their possible side effects are still unknown, tanning beds are not recommended.  Ultrasound Scans:  Routine ultrasounds are performed at approximately 20 weeks.  You will be able to see your baby's general anatomy an if you would like to know the gender this can usually be determined as well.  If it is questionable when you conceived you may also receive an ultrasound early in your pregnancy for dating purposes.  Otherwise ultrasound exams are not routinely performed unless there is a medical necessity.  Although you can request a scan we ask that you pay for it when conducted because insurance does not cover " patient request" scans.  Work: If your pregnancy proceeds without complications you may work until your due date,  unless your physician or employer advises otherwise.  Round Ligament Pain/Pelvic Discomfort:  Sharp, shooting pains not associated with bleeding are fairly common, usually occurring in the second trimester of pregnancy.  They tend to be worse when standing up or when you remain standing for long periods of time.  These are the result of pressure of certain pelvic ligaments called "round ligaments".  Rest, Tylenol and heat seem to be the most effective relief.  As the  womb and fetus grow, they rise out of the pelvis and the discomfort improves.  Please notify the office if your pain seems different than that described.  It may represent a more serious condition.  First Trimester of Pregnancy The first trimester of pregnancy is from week 1 until the end of week 13 (months 1 through 3). A week after a sperm fertilizes an egg, the egg will implant on the wall of the uterus. This embryo will begin to develop into a baby. Genes from you and your partner will form the baby. The female genes will determine whether the baby will be a boy or a girl. At 6-8 weeks, the eyes and face will be formed, and the heartbeat can be seen on ultrasound. At the end of 12 weeks, all the baby's organs will be formed. Now that you are pregnant, you will want to do everything you can to have a healthy baby. Two of the most important things are to get good prenatal care and to follow your health care provider's instructions. Prenatal care is all the medical care you receive before the baby's birth. This care will help prevent, find, and treat any problems during the pregnancy and childbirth. Body changes during your first trimester Your body goes through many changes during pregnancy. The changes vary from woman to woman.  You may gain or lose a couple of pounds at first.  You may feel sick to your stomach (nauseous) and you may throw up (vomit). If the vomiting is uncontrollable, call your health care provider.  You may tire  easily.  You may develop headaches that can be relieved by medicines. All medicines should be approved by your health care provider.  You may urinate more often. Painful urination may mean you have a bladder infection.  You may develop heartburn as a result of your pregnancy.  You may develop constipation because certain hormones are causing the muscles that push stool through your intestines to slow down.  You may develop hemorrhoids or swollen veins (varicose veins).  Your breasts may begin to grow larger and become tender. Your nipples may stick out more, and the tissue that surrounds them (areola) may become darker.  Your gums may bleed and may be sensitive to brushing and flossing.  Dark spots or blotches (chloasma, mask of pregnancy) may develop on your face. This will likely fade after the baby is born.  Your menstrual periods will stop.  You may have a loss of appetite.  You may develop cravings for certain kinds of food.  You may have changes in your emotions from day to day, such as being excited to be pregnant or being concerned that something may go wrong with the pregnancy and baby.  You may have more vivid and strange dreams.  You may have changes in your hair. These can include thickening of your hair, rapid growth, and changes in texture. Some women also have hair loss during or after pregnancy, or hair that feels dry or thin. Your hair will most likely return to normal after your baby is born. What to expect at prenatal visits During a routine prenatal visit:  You will be weighed to make sure you and the baby are growing normally.  Your blood pressure will be taken.  Your abdomen will be measured to track your baby's growth.  The fetal heartbeat will be listened to between weeks 10 and 14 of your pregnancy.  Test results from any previous visits will be discussed. Your  health care provider may ask you:  How you are feeling.  If you are feeling the baby  move.  If you have had any abnormal symptoms, such as leaking fluid, bleeding, severe headaches, or abdominal cramping.  If you are using any tobacco products, including cigarettes, chewing tobacco, and electronic cigarettes.  If you have any questions. Other tests that may be performed during your first trimester include:  Blood tests to find your blood type and to check for the presence of any previous infections. The tests will also be used to check for low iron levels (anemia) and protein on red blood cells (Rh antibodies). Depending on your risk factors, or if you previously had diabetes during pregnancy, you may have tests to check for high blood sugar that affects pregnant women (gestational diabetes).  Urine tests to check for infections, diabetes, or protein in the urine.  An ultrasound to confirm the proper growth and development of the baby.  Fetal screens for spinal cord problems (spina bifida) and Down syndrome.  HIV (human immunodeficiency virus) testing. Routine prenatal testing includes screening for HIV, unless you choose not to have this test.  You may need other tests to make sure you and the baby are doing well. Follow these instructions at home: Medicines  Follow your health care provider's instructions regarding medicine use. Specific medicines may be either safe or unsafe to take during pregnancy.  Take a prenatal vitamin that contains at least 600 micrograms (mcg) of folic acid.  If you develop constipation, try taking a stool softener if your health care provider approves. Eating and drinking   Eat a balanced diet that includes fresh fruits and vegetables, whole grains, good sources of protein such as meat, eggs, or tofu, and low-fat dairy. Your health care provider will help you determine the amount of weight gain that is right for you.  Avoid raw meat and uncooked cheese. These carry germs that can cause birth defects in the baby.  Eating four or five  small meals rather than three large meals a day may help relieve nausea and vomiting. If you start to feel nauseous, eating a few soda crackers can be helpful. Drinking liquids between meals, instead of during meals, also seems to help ease nausea and vomiting.  Limit foods that are high in fat and processed sugars, such as fried and sweet foods.  To prevent constipation: ? Eat foods that are high in fiber, such as fresh fruits and vegetables, whole grains, and beans. ? Drink enough fluid to keep your urine clear or pale yellow. Activity  Exercise only as directed by your health care provider. Most women can continue their usual exercise routine during pregnancy. Try to exercise for 30 minutes at least 5 days a week. Exercising will help you: ? Control your weight. ? Stay in shape. ? Be prepared for labor and delivery.  Experiencing pain or cramping in the lower abdomen or lower back is a good sign that you should stop exercising. Check with your health care provider before continuing with normal exercises.  Try to avoid standing for long periods of time. Move your legs often if you must stand in one place for a long time.  Avoid heavy lifting.  Wear low-heeled shoes and practice good posture.  You may continue to have sex unless your health care provider tells you not to. Relieving pain and discomfort  Wear a good support bra to relieve breast tenderness.  Take warm sitz baths to soothe any pain  or discomfort caused by hemorrhoids. Use hemorrhoid cream if your health care provider approves.  Rest with your legs elevated if you have leg cramps or low back pain.  If you develop varicose veins in your legs, wear support hose. Elevate your feet for 15 minutes, 3-4 times a day. Limit salt in your diet. Prenatal care  Schedule your prenatal visits by the twelfth week of pregnancy. They are usually scheduled monthly at first, then more often in the last 2 months before delivery.  Write  down your questions. Take them to your prenatal visits.  Keep all your prenatal visits as told by your health care provider. This is important. Safety  Wear your seat belt at all times when driving.  Make a list of emergency phone numbers, including numbers for family, friends, the hospital, and police and fire departments. General instructions  Ask your health care provider for a referral to a local prenatal education class. Begin classes no later than the beginning of month 6 of your pregnancy.  Ask for help if you have counseling or nutritional needs during pregnancy. Your health care provider can offer advice or refer you to specialists for help with various needs.  Do not use hot tubs, steam rooms, or saunas.  Do not douche or use tampons or scented sanitary pads.  Do not cross your legs for long periods of time.  Avoid cat litter boxes and soil used by cats. These carry germs that can cause birth defects in the baby and possibly loss of the fetus by miscarriage or stillbirth.  Avoid all smoking, herbs, alcohol, and medicines not prescribed by your health care provider. Chemicals in these products affect the formation and growth of the baby.  Do not use any products that contain nicotine or tobacco, such as cigarettes and e-cigarettes. If you need help quitting, ask your health care provider. You may receive counseling support and other resources to help you quit.  Schedule a dentist appointment. At home, brush your teeth with a soft toothbrush and be gentle when you floss. Contact a health care provider if:  You have dizziness.  You have mild pelvic cramps, pelvic pressure, or nagging pain in the abdominal area.  You have persistent nausea, vomiting, or diarrhea.  You have a bad smelling vaginal discharge.  You have pain when you urinate.  You notice increased swelling in your face, hands, legs, or ankles.  You are exposed to fifth disease or chickenpox.  You are  exposed to [place] measles (rubella) and have never had it. Get help right away if:  You have a fever.  You are leaking fluid from your vagina.  You have spotting or bleeding from your vagina.  You have severe abdominal cramping or pain.  You have rapid weight gain or loss.  You vomit blood or material that looks like coffee grounds.  You develop a severe headache.  You have shortness of breath.  You have any kind of trauma, such as from a fall or a car accident. Summary  The first trimester of pregnancy is from week 1 until the end of week 13 (months 1 through 3).  Your body goes through many changes during pregnancy. The changes vary from woman to woman.  You will have routine prenatal visits. During those visits, your health care provider will examine you, discuss any test results you may have, and talk with you about how you are feeling. This information is not intended to replace advice given to you by  your health care provider. Make sure you discuss any questions you have with your health care provider. Document Released: 03/29/2001 Document Revised: 03/17/2017 Document Reviewed: 03/16/2016 Elsevier Patient Education  2020 [employer].

## 2019-03-07 ENCOUNTER — Other Ambulatory Visit: Payer: Self-pay

## 2019-03-07 ENCOUNTER — Ambulatory Visit (INDEPENDENT_AMBULATORY_CARE_PROVIDER_SITE_OTHER): Payer: BC Managed Care – PPO | Admitting: Certified Nurse Midwife

## 2019-03-07 ENCOUNTER — Ambulatory Visit: Payer: BC Managed Care – PPO

## 2019-03-07 DIAGNOSIS — N912 Amenorrhea, unspecified: Secondary | ICD-10-CM

## 2019-03-07 NOTE — Progress Notes (Signed)
Pt rescheduled appointment.

## 2019-03-26 NOTE — Patient Instructions (Signed)
First Trimester of Pregnancy °The first trimester of pregnancy is from week 1 until the end of week 13 (months 1 through 3). A week after a sperm fertilizes an egg, the egg will implant on the wall of the uterus. This embryo will begin to develop into a baby. Genes from you and your partner will form the baby. The female genes will determine whether the baby will be a boy or a girl. At 6-8 weeks, the eyes and face will be formed, and the heartbeat can be seen on ultrasound. At the end of 12 weeks, all the baby's organs will be formed. °Now that you are pregnant, you will want to do everything you can to have a healthy baby. Two of the most important things are to get good prenatal care and to follow your health care provider's instructions. Prenatal care is all the medical care you receive before the baby's birth. This care will help prevent, find, and treat any problems during the pregnancy and childbirth. °Body changes during your first trimester °Your body goes through many changes during pregnancy. The changes vary from woman to woman. °· You may gain or lose a couple of pounds at first. °· You may feel sick to your stomach (nauseous) and you may throw up (vomit). If the vomiting is uncontrollable, call your health care provider. °· You may tire easily. °· You may develop headaches that can be relieved by medicines. All medicines should be approved by your health care provider. °· You may urinate more often. Painful urination may mean you have a bladder infection. °· You may develop heartburn as a result of your pregnancy. °· You may develop constipation because certain hormones are causing the muscles that push stool through your intestines to slow down. °· You may develop hemorrhoids or swollen veins (varicose veins). °· Your breasts may begin to grow larger and become tender. Your nipples may stick out more, and the tissue that surrounds them (areola) may become darker. °· Your gums may bleed and may be  sensitive to brushing and flossing. °· Dark spots or blotches (chloasma, mask of pregnancy) may develop on your face. This will likely fade after the baby is born. °· Your menstrual periods will stop. °· You may have a loss of appetite. °· You may develop cravings for certain kinds of food. °· You may have changes in your emotions from day to day, such as being excited to be pregnant or being concerned that something may go wrong with the pregnancy and baby. °· You may have more vivid and strange dreams. °· You may have changes in your hair. These can include thickening of your hair, rapid growth, and changes in texture. Some women also have hair loss during or after pregnancy, or hair that feels dry or thin. Your hair will most likely return to normal after your baby is born. °What to expect at prenatal visits °During a routine prenatal visit: °· You will be weighed to make sure you and the baby are growing normally. °· Your blood pressure will be taken. °· Your abdomen will be measured to track your baby's growth. °· The fetal heartbeat will be listened to between weeks 10 and 14 of your pregnancy. °· Test results from any previous visits will be discussed. °Your health care provider may ask you: °· How you are feeling. °· If you are feeling the baby move. °· If you have had any abnormal symptoms, such as leaking fluid, bleeding, severe headaches, or abdominal   cramping. °· If you are using any tobacco products, including cigarettes, chewing tobacco, and electronic cigarettes. °· If you have any questions. °Other tests that may be performed during your first trimester include: °· Blood tests to find your blood type and to check for the presence of any previous infections. The tests will also be used to check for low iron levels (anemia) and protein on red blood cells (Rh antibodies). Depending on your risk factors, or if you previously had diabetes during pregnancy, you may have tests to check for high blood sugar  that affects pregnant women (gestational diabetes). °· Urine tests to check for infections, diabetes, or protein in the urine. °· An ultrasound to confirm the proper growth and development of the baby. °· Fetal screens for spinal cord problems (spina bifida) and Down syndrome. °· HIV (human immunodeficiency virus) testing. Routine prenatal testing includes screening for HIV, unless you choose not to have this test. °· You may need other tests to make sure you and the baby are doing well. °Follow these instructions at home: °Medicines °· Follow your health care provider's instructions regarding medicine use. Specific medicines may be either safe or unsafe to take during pregnancy. °· Take a prenatal vitamin that contains at least 600 micrograms (mcg) of folic acid. °· If you develop constipation, try taking a stool softener if your health care provider approves. °Eating and drinking ° °· Eat a balanced diet that includes fresh fruits and vegetables, whole grains, good sources of protein such as meat, eggs, or tofu, and low-fat dairy. Your health care provider will help you determine the amount of weight gain that is right for you. °· Avoid raw meat and uncooked cheese. These carry germs that can cause birth defects in the baby. °· Eating four or five small meals rather than three large meals a day may help relieve nausea and vomiting. If you start to feel nauseous, eating a few soda crackers can be helpful. Drinking liquids between meals, instead of during meals, also seems to help ease nausea and vomiting. °· Limit foods that are high in fat and processed sugars, such as fried and sweet foods. °· To prevent constipation: °? Eat foods that are high in fiber, such as fresh fruits and vegetables, whole grains, and beans. °? Drink enough fluid to keep your urine clear or pale yellow. °Activity °· Exercise only as directed by your health care provider. Most women can continue their usual exercise routine during  pregnancy. Try to exercise for 30 minutes at least 5 days a week. Exercising will help you: °? Control your weight. °? Stay in shape. °? Be prepared for labor and delivery. °· Experiencing pain or cramping in the lower abdomen or lower back is a good sign that you should stop exercising. Check with your health care provider before continuing with normal exercises. °· Try to avoid standing for long periods of time. Move your legs often if you must stand in one place for a long time. °· Avoid heavy lifting. °· Wear low-heeled shoes and practice good posture. °· You may continue to have sex unless your health care provider tells you not to. °Relieving pain and discomfort °· Wear a good support bra to relieve breast tenderness. °· Take warm sitz baths to soothe any pain or discomfort caused by hemorrhoids. Use hemorrhoid cream if your health care provider approves. °· Rest with your legs elevated if you have leg cramps or low back pain. °· If you develop varicose veins in   your legs, wear support hose. Elevate your feet for 15 minutes, 3-4 times a day. Limit salt in your diet. °Prenatal care °· Schedule your prenatal visits by the twelfth week of pregnancy. They are usually scheduled monthly at first, then more often in the last 2 months before delivery. °· Write down your questions. Take them to your prenatal visits. °· Keep all your prenatal visits as told by your health care provider. This is important. °Safety °· Wear your seat belt at all times when driving. °· Make a list of emergency phone numbers, including numbers for family, friends, the hospital, and police and fire departments. °General instructions °· Ask your health care provider for a referral to a local prenatal education class. Begin classes no later than the beginning of month 6 of your pregnancy. °· Ask for help if you have counseling or nutritional needs during pregnancy. Your health care provider can offer advice or refer you to specialists for help  with various needs. °· Do not use hot tubs, steam rooms, or saunas. °· Do not douche or use tampons or scented sanitary pads. °· Do not cross your legs for long periods of time. °· Avoid cat litter boxes and soil used by cats. These carry germs that can cause birth defects in the baby and possibly loss of the fetus by miscarriage or stillbirth. °· Avoid all smoking, herbs, alcohol, and medicines not prescribed by your health care provider. Chemicals in these products affect the formation and growth of the baby. °· Do not use any products that contain nicotine or tobacco, such as cigarettes and e-cigarettes. If you need help quitting, ask your health care provider. You may receive counseling support and other resources to help you quit. °· Schedule a dentist appointment. At home, brush your teeth with a soft toothbrush and be gentle when you floss. °Contact a health care provider if: °· You have dizziness. °· You have mild pelvic cramps, pelvic pressure, or nagging pain in the abdominal area. °· You have persistent nausea, vomiting, or diarrhea. °· You have a bad smelling vaginal discharge. °· You have pain when you urinate. °· You notice increased swelling in your face, hands, legs, or ankles. °· You are exposed to fifth disease or chickenpox. °· You are exposed to German measles (rubella) and have never had it. °Get help right away if: °· You have a fever. °· You are leaking fluid from your vagina. °· You have spotting or bleeding from your vagina. °· You have severe abdominal cramping or pain. °· You have rapid weight gain or loss. °· You vomit blood or material that looks like coffee grounds. °· You develop a severe headache. °· You have shortness of breath. °· You have any kind of trauma, such as from a fall or a car accident. °Summary °· The first trimester of pregnancy is from week 1 until the end of week 13 (months 1 through 3). °· Your body goes through many changes during pregnancy. The changes vary from  woman to woman. °· You will have routine prenatal visits. During those visits, your health care provider will examine you, discuss any test results you may have, and talk with you about how you are feeling. °This information is not intended to replace advice given to you by your health care provider. Make sure you discuss any questions you have with your health care provider. °Document Released: 03/29/2001 Document Revised: 03/17/2017 Document Reviewed: 03/16/2016 °Elsevier Patient Education © 2020 [employer] ° °Commonly Asked   Questions During Pregnancy ° °Cats: A parasite can be excreted in cat feces.  To avoid exposure you need to have another person empty the little box.  If you must empty the litter box you will need to wear gloves.  Wash your hands after handling your cat.  This parasite can also be found in raw or undercooked meat so this should also be avoided. ° °Colds, Sore Throats, Flu: Please check your medication sheet to see what you can take for symptoms.  If your symptoms are unrelieved by these medications please call the office. ° °Dental Work: Most any dental work your dentist recommends is permitted.  X-rays should only be taken during the first trimester if absolutely necessary.  Your abdomen should be shielded with a lead apron during all x-rays.  Please notify your provider prior to receiving any x-rays.  Novocaine is fine; gas is not recommended.  If your dentist requires a note from us prior to dental work please call the office and we will provide one for you. ° °Exercise: Exercise is an important part of staying healthy during your pregnancy.  You may continue most exercises you were accustomed to prior to pregnancy.  Later in your pregnancy you will most likely notice you have difficulty with activities requiring balance like riding a bicycle.  It is important that you listen to your body and avoid activities that put you at a higher risk of falling.  Adequate rest and staying well  hydrated are a must!  If you have questions about the safety of specific activities ask your provider.   ° °Exposure to Children with illness: Try to avoid obvious exposure; report any symptoms to us when noted,  If you have chicken pos, red measles or mumps, you should be immune to these diseases.   Please do not take any vaccines while pregnant unless you have checked with your OB provider. ° °Fetal Movement: After 28 weeks we recommend you do "kick counts" twice daily.  Lie or sit down in a calm quiet environment and count your baby movements "kicks".  You should feel your baby at least 10 times per hour.  If you have not felt 10 kicks within the first hour get up, walk around and have something sweet to eat or drink then repeat for an additional hour.  If count remains less than 10 per hour notify your provider. ° °Fumigating: Follow your pest control agent's advice as to how long to stay out of your home.  Ventilate the area well before re-entering. ° °Hemorrhoids:   Most over-the-counter preparations can be used during pregnancy.  Check your medication to see what is safe to use.  It is important to use a stool softener or fiber in your diet and to drink lots of liquids.  If hemorrhoids seem to be getting worse please call the office.  ° °Hot Tubs:  Hot tubs Jacuzzis and saunas are not recommended while pregnant.  These increase your internal body temperature and should be avoided. ° °Intercourse:  Sexual intercourse is safe during pregnancy as long as you are comfortable, unless otherwise advised by your provider.  Spotting may occur after intercourse; report any bright red bleeding that is heavier than spotting. ° °Labor:  If you know that you are in labor, please go to the hospital.  If you are unsure, please call the office and let us help you decide what to do. ° °Lifting, straining, etc:  If your job requires heavy lifting or   straining please check with your provider for any limitations.  Generally, you  should not lift items heavier than that you can lift simply with your hands and arms (no back muscles) ° °Painting:  Paint fumes do not harm your pregnancy, but may make you ill and should be avoided if possible.  Latex or water based paints have less odor than oils.  Use adequate ventilation while painting. ° °Permanents & Hair Color:  Chemicals in hair dyes are not recommended as they cause increase hair dryness which can increase hair loss during pregnancy.  " Highlighting" and permanents are allowed.  Dye may be absorbed differently and permanents may not hold as well during pregnancy. ° °Sunbathing:  Use a sunscreen, as skin burns easily during pregnancy.  Drink plenty of fluids; avoid over heating. ° °Tanning Beds:  Because their possible side effects are still unknown, tanning beds are not recommended. ° °Ultrasound Scans:  Routine ultrasounds are performed at approximately 20 weeks.  You will be able to see your baby's general anatomy an if you would like to know the gender this can usually be determined as well.  If it is questionable when you conceived you may also receive an ultrasound early in your pregnancy for dating purposes.  Otherwise ultrasound exams are not routinely performed unless there is a medical necessity.  Although you can request a scan we ask that you pay for it when conducted because insurance does not cover " patient request" scans. ° °Work: If your pregnancy proceeds without complications you may work until your due date, unless your physician or employer advises otherwise. ° °Round Ligament Pain/Pelvic Discomfort:  Sharp, shooting pains not associated with bleeding are fairly common, usually occurring in the second trimester of pregnancy.  They tend to be worse when standing up or when you remain standing for long periods of time.  These are the result of pressure of certain pelvic ligaments called "round ligaments".  Rest, Tylenol and heat seem to be the most effective relief.  As  the womb and fetus grow, they rise out of the pelvis and the discomfort improves.  Please notify the office if your pain seems different than that described.  It may represent a more serious condition. ° ° °How a Baby Grows During Pregnancy ° °Pregnancy begins when a female's sperm enters a female's egg (fertilization). Fertilization usually happens in one of the tubes (fallopian tubes) that connect the ovaries to the womb (uterus). The fertilized egg moves down the fallopian tube to the uterus. Once it reaches the uterus, it implants into the lining of the uterus and begins to grow. °For the first 10 weeks, the fertilized egg is called an embryo. After 10 weeks, it is called a fetus. As the fetus continues to grow, it receives oxygen and nutrients through tissue (placenta) that grows to support the developing baby. The placenta is the life support system for the baby. It provides oxygen and nutrition and removes waste. °Learning as much as you can about your pregnancy and how your baby is developing can help you enjoy the experience. It can also make you aware of when there might be a problem and when to ask questions. °How long does a typical pregnancy last? °A pregnancy usually lasts 280 days, or about 40 weeks. Pregnancy is divided into three periods of growth, also called trimesters: °· First trimester: 0-12 weeks. °· Second trimester: 13-27 weeks. °· Third trimester: 28-40 weeks. °The day when your baby is ready to be   born (full term) is your estimated date of delivery. °How does my baby develop month by month? °First month °· The fertilized egg attaches to the inside of the uterus. °· Some cells will form the placenta. Others will form the fetus. °· The arms, legs, brain, spinal cord, lungs, and heart begin to develop. °· At the end of the first month, the heart begins to beat. °Second month °· The bones, inner ear, eyelids, hands, and feet form. °· The genitals develop. °· By the end of 8 weeks, all major  organs are developing. °Third month °· All of the internal organs are forming. °· Teeth develop below the gums. °· Bones and muscles begin to grow. The spine can flex. °· The skin is transparent. °· Fingernails and toenails begin to form. °· Arms and legs continue to grow longer, and hands and feet develop. °· The fetus is about 3 inches (7.6 cm) long. °Fourth month °· The placenta is completely formed. °· The external sex organs, neck, outer ear, eyebrows, eyelids, and fingernails are formed. °· The fetus can hear, swallow, and move its arms and legs. °· The kidneys begin to produce urine. °· The skin is covered with a white, waxy coating (vernix) and very fine hair (lanugo). °Fifth month °· The fetus moves around more and can be felt for the first time (quickening). °· The fetus starts to sleep and wake up and may begin to suck its finger. °· The nails grow to the end of the fingers. °· The organ in the digestive system that makes bile (gallbladder) functions and helps to digest nutrients. °· If your baby is a girl, eggs are present in her ovaries. If your baby is a boy, testicles start to move down into his scrotum. °Sixth month °· The lungs are formed. °· The eyes open. The brain continues to develop. °· Your baby has fingerprints and toe prints. Your baby's hair grows thicker. °· At the end of the second trimester, the fetus is about 9 inches (22.9 cm) long. °Seventh month °· The fetus kicks and stretches. °· The eyes are developed enough to sense changes in light. °· The hands can make a grasping motion. °· The fetus responds to sound. °Eighth month °· All organs and body systems are fully developed and functioning. °· Bones harden, and taste buds develop. The fetus may hiccup. °· Certain areas of the brain are still developing. The skull remains soft. °Ninth month °· The fetus gains about ½ lb (0.23 kg) each week. °· The lungs are fully developed. °· Patterns of sleep develop. °· The fetus's head typically  moves into a head-down position (vertex) in the uterus to prepare for birth. °· The fetus weighs 6-9 lb (2.72-4.08 kg) and is 19-20 inches (48.26-50.8 cm) long. °What can I do to have a healthy pregnancy and help my baby develop? °General instructions °· Take prenatal vitamins as directed by your health care provider. These include vitamins such as folic acid, iron, calcium, and vitamin D. They are important for healthy development. °· Take medicines only as directed by your health care provider. Read labels and ask a pharmacist or your health care provider whether over-the-counter medicines, supplements, and prescription drugs are safe to take during pregnancy. °· Keep all follow-up visits as directed by your health care provider. This is important. Follow-up visits include prenatal care and screening tests. °How do I know if my baby is developing well? °At each prenatal visit, your health care provider will do   several different tests to check on your health and keep track of your baby's development. These include: °· Fundal height and position. °? Your health care provider will measure your growing belly from your pubic bone to the top of the uterus using a tape measure. °? Your health care provider will also feel your belly to determine your baby's position. °· Heartbeat. °? An ultrasound in the first trimester can confirm pregnancy and show a heartbeat, depending on how far along you are. °? Your health care provider will check your baby's heart rate at every prenatal visit. °· Second trimester ultrasound. °? This ultrasound checks your baby's development. It also may show your baby's gender. °What should I do if I have concerns about my baby's development? °Always talk with your health care provider about any concerns that you may have about your pregnancy and your baby. °Summary °· A pregnancy usually lasts 280 days, or about 40 weeks. Pregnancy is divided into three periods of growth, also called  trimesters. °· Your health care provider will monitor your baby's growth and development throughout your pregnancy. °· Follow your health care provider's recommendations about taking prenatal vitamins and medicines during your pregnancy. °· Talk with your health care provider if you have any concerns about your pregnancy or your developing baby. °This information is not intended to replace advice given to you by your health care provider. Make sure you discuss any questions you have with your health care provider. °Document Released: 09/21/2007 Document Revised: 07/26/2018 Document Reviewed: 02/15/2017 °Elsevier Patient Education © 2020 [employer] °  °

## 2019-03-27 ENCOUNTER — Ambulatory Visit (INDEPENDENT_AMBULATORY_CARE_PROVIDER_SITE_OTHER): Payer: BC Managed Care – PPO

## 2019-03-27 ENCOUNTER — Other Ambulatory Visit: Payer: Self-pay

## 2019-03-27 ENCOUNTER — Ambulatory Visit: Payer: BC Managed Care – PPO | Admitting: Certified Nurse Midwife

## 2019-03-27 VITALS — BP 105/72 | HR 89 | Ht 64.0 in | Wt 124.7 lb

## 2019-03-27 DIAGNOSIS — O3481 Maternal care for other abnormalities of pelvic organs, first trimester: Secondary | ICD-10-CM

## 2019-03-27 DIAGNOSIS — N8311 Corpus luteum cyst of right ovary: Secondary | ICD-10-CM | POA: Diagnosis not present

## 2019-03-27 DIAGNOSIS — Z3491 Encounter for supervision of normal pregnancy, unspecified, first trimester: Secondary | ICD-10-CM

## 2019-03-27 DIAGNOSIS — Z3A08 8 weeks gestation of pregnancy: Secondary | ICD-10-CM | POA: Diagnosis not present

## 2019-03-27 DIAGNOSIS — Z0283 Encounter for blood-alcohol and blood-drug test: Secondary | ICD-10-CM

## 2019-03-27 DIAGNOSIS — Z113 Encounter for screening for infections with a predominantly sexual mode of transmission: Secondary | ICD-10-CM

## 2019-03-27 LAB — OB RESULTS CONSOLE VARICELLA ZOSTER ANTIBODY, IGG: Varicella: NON-IMMUNE/NOT IMMUNE

## 2019-03-27 NOTE — Progress Notes (Signed)
Donell Beers presents for NOB nurse interview visit.  Pregnancy confirmation done at [facility].  G-1.  P-0.  LMP 01/19/2019.  EDD 10/26/2019. Dating scan done 03/27/2019 [facility] [redacted]w[redacted]d.  Pregnancy education material explained and given.  No cats in the home.  NOB labs ordered.  TSH/HgA1C not ordered.  Body mass index is 21.4 kg/m.  Sickle cell not ordered. HIV and drug screen were explained and ordered.  PNV encouraged.  Genetic screening options discussed.  Genetic testing: Unsure.  FMLA form signed and financial policy reviewed.  Patient already scheduled for NOB physical with [facility].

## 2019-03-28 LAB — RPR: RPR Ser Ql: NONREACTIVE

## 2019-03-28 LAB — ANTIBODY SCREEN: Antibody Screen: NEGATIVE

## 2019-03-28 LAB — URINALYSIS, ROUTINE W REFLEX MICROSCOPIC
Bilirubin, UA: NEGATIVE
Glucose, UA: NEGATIVE
Nitrite, UA: NEGATIVE
Protein,UA: NEGATIVE
RBC, UA: NEGATIVE
Specific Gravity, UA: 1.01 (ref 1.005–1.030)
Urobilinogen, Ur: 0.2 mg/dL (ref 0.2–1.0)
pH, UA: 6.5 (ref 5.0–7.5)

## 2019-03-28 LAB — HEPATITIS B SURFACE ANTIGEN: Hepatitis B Surface Ag: NEGATIVE

## 2019-03-28 LAB — VARICELLA ZOSTER ANTIBODY, IGG: Varicella zoster IgG: <135 index — ABNORMAL LOW (ref >165)

## 2019-03-28 LAB — HGB SOLU + RFLX FRAC: Sickle Solubility Test - HGBRFX: NEGATIVE

## 2019-03-28 LAB — MICROSCOPIC EXAMINATION: Casts: NONE SEEN /lpf

## 2019-03-28 LAB — ABO AND RH: Rh Factor: POSITIVE

## 2019-03-28 LAB — HIV ANTIBODY (ROUTINE TESTING W REFLEX): HIV Screen 4th Generation wRfx: NONREACTIVE

## 2019-03-28 LAB — RUBELLA SCREEN: Rubella Antibodies, IGG: 1 index (ref >0.99)

## 2019-03-29 LAB — GC/CHLAMYDIA PROBE AMP
Chlamydia trachomatis, NAA: NEGATIVE
Neisseria Gonorrhoeae by PCR: NEGATIVE

## 2019-03-29 LAB — URINE CULTURE, OB REFLEX

## 2019-03-29 LAB — CULTURE, OB URINE

## 2019-04-01 LAB — MONITOR DRUG PROFILE 14(MW)
Amphetamine Scrn, Ur: NEGATIVE ng/mL
BARBITURATE SCREEN URINE: NEGATIVE ng/mL
BENZODIAZEPINE SCREEN, URINE: NEGATIVE ng/mL
Buprenorphine, Urine: NEGATIVE ng/mL
Cocaine (Metab) Scrn, Ur: NEGATIVE ng/mL
Creatinine(Crt), U: 24.3 mg/dL (ref 20.0–300.0)
Fentanyl, Urine: NEGATIVE pg/mL
Meperidine Screen, Urine: NEGATIVE ng/mL
Methadone Screen, Urine: NEGATIVE ng/mL
OXYCODONE+OXYMORPHONE UR QL SCN: NEGATIVE ng/mL
Opiate Scrn, Ur: NEGATIVE ng/mL
Ph of Urine: 6.3 (ref 4.5–8.9)
Phencyclidine Qn, Ur: NEGATIVE ng/mL
Propoxyphene Scrn, Ur: NEGATIVE ng/mL
SPECIFIC GRAVITY: 1.006
Tramadol Screen, Urine: NEGATIVE ng/mL

## 2019-04-01 LAB — CANNABINOID (GC/MS), URINE: Cannabinoid: NEGATIVE

## 2019-04-01 LAB — NICOTINE SCREEN, URINE: Cotinine Ql Scrn, Ur: NEGATIVE ng/mL

## 2019-04-10 ENCOUNTER — Other Ambulatory Visit (HOSPITAL_COMMUNITY)
Admission: RE | Admit: 2019-04-10 | Discharge: 2019-04-10 | Disposition: A | Discharge status: Home / Self Care | Payer: BC Managed Care – PPO | Source: Ambulatory Visit | Attending: Certified Nurse Midwife | Admitting: Certified Nurse Midwife

## 2019-04-10 ENCOUNTER — Encounter: Payer: Self-pay | Admitting: Certified Nurse Midwife

## 2019-04-10 ENCOUNTER — Ambulatory Visit (INDEPENDENT_AMBULATORY_CARE_PROVIDER_SITE_OTHER): Payer: BC Managed Care – PPO | Admitting: Certified Nurse Midwife

## 2019-04-10 ENCOUNTER — Other Ambulatory Visit: Payer: Self-pay

## 2019-04-10 ENCOUNTER — Encounter: Payer: BC Managed Care – PPO | Admitting: Obstetrics and Gynecology

## 2019-04-10 VITALS — BP 111/71 | HR 91 | Wt 131.3 lb

## 2019-04-10 DIAGNOSIS — Z124 Encounter for screening for malignant neoplasm of cervix: Secondary | ICD-10-CM | POA: Diagnosis present

## 2019-04-10 DIAGNOSIS — Z3401 Encounter for supervision of normal first pregnancy, first trimester: Secondary | ICD-10-CM

## 2019-04-10 NOTE — Progress Notes (Signed)
NEW OB HISTORY AND PHYSICAL  SUBJECTIVE:       Karen Zuniga is a 28 y.o. G1P0000 female, Patient's last menstrual period was 01/19/2019 (exact date)., Estimated Date of Delivery: 10/26/19, [redacted]w[redacted]d, presents today for establishment of Prenatal Care. She has no unusual complaints.    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   Gynecologic History Patient's last menstrual period was 01/19/2019 (exact date). Normal Contraception: none Last Pap: 2018. Results were: normal  Obstetric History OB History  Gravida Para Term Preterm AB Living  1 0 0 0 0 0  SAB TAB Ectopic Multiple Live Births  0 0 0 0 0    # Outcome Date GA Lbr Len/2nd Weight Sex Delivery Anes PTL Lv  1 Current             Past Medical History:  Diagnosis Date  . Ovarian cyst     Past Surgical History:  Procedure Laterality Date  . LAPAROSCOPIC APPENDECTOMY N/A 11/15/2017   Procedure: APPENDECTOMY LAPAROSCOPIC;  Surgeon: Leafy Ro, MD;  Location: [facility] ORS;  Service: General;  Laterality: N/A;    Current Outpatient Medications on File Prior to Visit  Medication Sig Dispense Refill  . EPINEPHrine 0.3 mg/0.3 mL IJ SOAJ injection Inject 3 mg into the muscle as needed.    . loratadine (ALLERGY) 10 MG tablet Take 10 mg by mouth daily.    . Prenatal Vit-Fe Fumarate-FA (MULTIVITAMIN-PRENATAL) 27-0.8 MG TABS tablet Take 1 tablet by mouth daily at 12 noon.     No current facility-administered medications on file prior to visit.    Allergies  Allergen Reactions  . Amoxicillin     Has patient had a PCN reaction causing immediate rash, facial/tongue/throat swelling, SOB or lightheadedness with hypotension: Unknown Has patient had a PCN reaction causing severe rash involving mucus membranes or skin necrosis: Unknown Has patient had a PCN reaction that required hospitalization: Unknown Has patient had a PCN reaction occurring within the last 10 years: Unknown If all of the above  answers are "NO", then may proceed with Cephalosporin use.    Social History   Socioeconomic History  . Marital status: Single    Spouse name: Not on file  . Number of children: Not on file  . Years of education: Not on file  . Highest education level: Not on file  Occupational History  . Not on file  Tobacco Use  . Smoking status: Never Smoker  . Smokeless tobacco: Never Used  Substance and Sexual Activity  . Alcohol use: Not Currently    Alcohol/week: 1.0 standard drinks    Types: 1 Glasses of wine per week  . Drug use: No  . Sexual activity: Yes    Birth control/protection: None  Other Topics Concern  . Not on file  Social History Narrative  . Not on file   Social Determinants of Health   Financial Resource Strain:   . Difficulty of Paying Living Expenses: Not on file  Food Insecurity:   . Worried About Programme researcher, broadcasting/film/video in the Last Year: Not on file  . Ran Out of Food in the Last Year: Not on file  Transportation Needs:   . Lack of Transportation (Medical): Not on file  . Lack of Transportation (Non-Medical): Not on file  Physical Activity:   . Days of Exercise per Week: Not on file  . Minutes of Exercise per Session: Not on file  Stress:   . Feeling of Stress : Not  on file  Social Connections:   . Frequency of Communication with Friends and Family: Not on file  . Frequency of Social Gatherings with Friends and Family: Not on file  . Attends Religious Services: Not on file  . Active Member of Clubs or Organizations: Not on file  . Attends Archivist Meetings: Not on file  . Marital Status: Not on file  Intimate Partner Violence:   . Fear of Current or Ex-Partner: Not on file  . Emotionally Abused: Not on file  . Physically Abused: Not on file  . Sexually Abused: Not on file    Family History  Problem Relation Age of Onset  . Cancer Father   . Stroke Father   . Diabetes Father   . Breast cancer Neg Hx   . Ovarian cancer Neg Hx   . Colon  cancer Neg Hx     The following portions of the patient's history were reviewed and updated as appropriate: allergies, current medications, past OB history, past medical history, past surgical history, past family history, past social history, and problem list.    OBJECTIVE: Initial Physical Exam (New OB)  GENERAL APPEARANCE: alert, well appearing, in no apparent distress, oriented to person, place and time HEAD: normocephalic, atraumatic MOUTH: mucous membranes moist, pharynx normal without lesions THYROID: no thyromegaly or masses present BREASTS: no masses noted, no significant tenderness, no palpable axillary nodes, no skin changes LUNGS: clear to auscultation, no wheezes, rales or rhonchi, symmetric air entry HEART: regular rate and rhythm, no murmurs ABDOMEN: soft, nontender, nondistended, no abnormal masses, no epigastric pain and FHT present EXTREMITIES: no redness or tenderness in the calves or thighs SKIN: normal coloration and turgor, no rashes LYMPH NODES: no adenopathy palpable NEUROLOGIC: alert, oriented, normal speech, no focal findings or movement disorder noted  PELVIC EXAM EXTERNAL GENITALIA: normal appearing vulva with no masses, tenderness or lesions VAGINA: no abnormal discharge or lesions CERVIX: no lesions or cervical motion tenderness UTERUS: gravid ADNEXA: no masses palpable and nontender OB EXAM PELVIMETRY: appears adequate RECTUM: exam not indicated  ASSESSMENT: Normal pregnancy  PLAN: New OB counseling: The patient has been given an overview regarding routine prenatal care. Recommendations regarding diet, weight gain, and exercise in pregnancy were given. Prenatal testing, optional genetic testing,carrier screening,  and ultrasound use in pregnancy were reviewed. Benefits of Breast Feeding were discussed. The patient is encouraged to consider nursing her baby post partum   Philip Aspen, CNM.

## 2019-04-10 NOTE — Patient Instructions (Signed)

## 2019-04-11 LAB — CBC
Hematocrit: 34.2 % (ref 34.0–46.6)
Hemoglobin: 11.9 g/dL (ref 11.1–15.9)
MCH: 33.2 pg — ABNORMAL HIGH (ref 26.6–33.0)
MCHC: 34.8 g/dL (ref 31.5–35.7)
MCV: 96 fL (ref 79–97)
Platelets: 266 10*3/uL (ref 150–450)
RBC: 3.58 x10E6/uL — ABNORMAL LOW (ref 3.77–5.28)
RDW: 11.6 % — ABNORMAL LOW (ref 11.7–15.4)
WBC: 7.8 10*3/uL (ref 3.4–10.8)

## 2019-04-16 LAB — CYTOLOGY - PAP: Diagnosis: NEGATIVE

## 2019-04-19 NOTE — L&D Delivery Note (Signed)
Delivery Summary for Karen Zuniga  Labor Events:   Preterm labor: No data found  Rupture date: No data found  Rupture time: No data found  Rupture type: Intact  Fluid Color: No data found  Induction: No data found  Augmentation: No data found  Complications: No data found  Cervical ripening: No data found No data found   No data found     Delivery:   Episiotomy: No data found  Lacerations: No data found  Repair suture: No data found  Repair # of packets: No data found  Blood loss (ml): 538   Information for the patient's newborn:  [name], [name] [[id number]]    Delivery 10/31/2019 11:49 AM by  C-Section, Low Transverse Sex:  female Gestational Age: [redacted]w[redacted]d Delivery Clinician:   Living?:         APGARS  One minute Five minutes Ten minutes  Skin color:        Heart rate:        Grimace:        Muscle tone:        Breathing:        Totals: 8  9      Presentation/position:      Resuscitation:   Cord information:    Disposition of cord blood:     Blood gases sent?  Complications:   Placenta: Delivered:       appearance Newborn Measurements: Weight: 8 lb 6 oz (3800 g)  Height: 20.47"  Head circumference:    Chest circumference:    Other providers:    Additional  information: Forceps:   Vacuum:   Breech:   Observed anomalies        See Dr. Oretha Milch operative note for details of C-section procedure.    Hildred Laser, MD [facility]

## 2019-05-15 ENCOUNTER — Other Ambulatory Visit: Payer: Self-pay

## 2019-05-15 ENCOUNTER — Encounter: Payer: Self-pay | Admitting: Certified Nurse Midwife

## 2019-05-15 ENCOUNTER — Ambulatory Visit (INDEPENDENT_AMBULATORY_CARE_PROVIDER_SITE_OTHER): Payer: BC Managed Care – PPO | Admitting: Certified Nurse Midwife

## 2019-05-15 DIAGNOSIS — Z3401 Encounter for supervision of normal first pregnancy, first trimester: Secondary | ICD-10-CM

## 2019-05-15 LAB — POCT URINALYSIS DIPSTICK OB
Bilirubin, UA: NEGATIVE
Blood, UA: NEGATIVE
Glucose, UA: NEGATIVE
Ketones, UA: NEGATIVE
Leukocytes, UA: NEGATIVE
Nitrite, UA: NEGATIVE
POC,PROTEIN,UA: NEGATIVE
Spec Grav, UA: <=1.005 — AB (ref 1.010–1.025)
Urobilinogen, UA: 0.2 E.U./dL
pH, UA: 5 (ref 5.0–8.0)

## 2019-05-15 NOTE — Progress Notes (Signed)
ROB doing well. Feels some movement. Discussed round ligment pain. U/s next visit. Vollow up 4 wks with Marcelino Duster.   Doreene Burke, CNM

## 2019-05-15 NOTE — Progress Notes (Signed)
Lactation student provided education in accordance with Ready,Set,Baby curriculum. Mother of baby reported all questions answered at this time.

## 2019-05-15 NOTE — Patient Instructions (Signed)

## 2019-06-13 ENCOUNTER — Ambulatory Visit (INDEPENDENT_AMBULATORY_CARE_PROVIDER_SITE_OTHER): Payer: BC Managed Care – PPO

## 2019-06-13 ENCOUNTER — Ambulatory Visit (INDEPENDENT_AMBULATORY_CARE_PROVIDER_SITE_OTHER): Payer: BC Managed Care – PPO | Admitting: Certified Nurse Midwife

## 2019-06-13 ENCOUNTER — Other Ambulatory Visit: Payer: Self-pay

## 2019-06-13 VITALS — BP 111/66 | HR 98 | Wt 139.3 lb

## 2019-06-13 DIAGNOSIS — Z3A20 20 weeks gestation of pregnancy: Secondary | ICD-10-CM

## 2019-06-13 DIAGNOSIS — Z3492 Encounter for supervision of normal pregnancy, unspecified, second trimester: Secondary | ICD-10-CM

## 2019-06-13 DIAGNOSIS — Z3401 Encounter for supervision of normal first pregnancy, first trimester: Secondary | ICD-10-CM

## 2019-06-13 LAB — POCT URINALYSIS DIPSTICK OB
Bilirubin, UA: NEGATIVE
Blood, UA: NEGATIVE
Glucose, UA: NEGATIVE
Ketones, UA: NEGATIVE
Leukocytes, UA: NEGATIVE
Nitrite, UA: NEGATIVE
POC,PROTEIN,UA: NEGATIVE
Spec Grav, UA: 1.01 (ref 1.010–1.025)
Urobilinogen, UA: 0.2 E.U./dL
pH, UA: 6.5 (ref 5.0–8.0)

## 2019-06-13 NOTE — Progress Notes (Signed)
ROB-Doing well, no questions or concerns. Anatomy scan today complete and normal, see below. [facility] [employer] given. Anticipatory guidance regarding course of prenatal care. Reviewed red flag symptoms and when to call. RTC x 4 weeks for ROB or sooner if needed.    ULTRASOUND REPORT  Location: [facility] Date of Service: 06/13/2019   Indications:Anatomy Ultrasound Findings:  Singleton intrauterine pregnancy is visualized with FHR at 156 BPM. Biometrics give an (U/S) Gestational age of [redacted]w[redacted]d and an (U/S) EDD of 10/24/2019; this correlates with the clinically established Estimated Date of Delivery: 10/26/19  Fetal presentation is Variable.  EFW: 369 g ( 13 oz). Fetal Percentile  Placenta: anterior. Grade: 1 AFI: subjectively normal.  Anatomic survey is complete and normal; Gender - female.    Right Ovary is normal in appearance. Left Ovary is normal appearance. Survey of the adnexa demonstrates no adnexal masses. There is no free peritoneal fluid in the cul de sac.  Impression: 1. [redacted]w[redacted]d Viable Singleton Intrauterine pregnancy by U/S. 2. (U/S) EDD is consistent with Clinically established Estimated Date of Delivery: 10/26/19 . 3. Normal Anatomy Scan  Recommendations: 1.Clinical correlation with the patient's History and Physical Exam.

## 2019-06-13 NOTE — Patient Instructions (Signed)

## 2019-06-13 NOTE — Progress Notes (Signed)
ROB-No complaints.  

## 2019-07-15 ENCOUNTER — Ambulatory Visit (INDEPENDENT_AMBULATORY_CARE_PROVIDER_SITE_OTHER): Payer: BC Managed Care – PPO | Admitting: Certified Nurse Midwife

## 2019-07-15 ENCOUNTER — Encounter: Payer: Self-pay | Admitting: Certified Nurse Midwife

## 2019-07-15 ENCOUNTER — Other Ambulatory Visit: Payer: Self-pay

## 2019-07-15 VITALS — BP 111/67 | HR 90 | Wt 147.4 lb

## 2019-07-15 DIAGNOSIS — Z3492 Encounter for supervision of normal pregnancy, unspecified, second trimester: Secondary | ICD-10-CM

## 2019-07-15 NOTE — Patient Instructions (Signed)
Td (Tetanus, Diphtheria) Vaccine: What You Need to Know 1. Why get vaccinated? Td vaccine can prevent tetanus and diphtheria. Tetanus enters the body through cuts or wounds. Diphtheria spreads from person to person.  TETANUS (T) causes painful stiffening of the muscles. Tetanus can lead to serious health problems, including being unable to open the mouth, having trouble swallowing and breathing, or death.  DIPHTHERIA (D) can lead to difficulty breathing, heart failure, paralysis, or death. 2. Td vaccine Td is only for children 7 years and older, adolescents, and adults.  Td is usually given as a booster dose every 10 years, but it can also be given earlier after a severe and dirty wound or burn. Another vaccine, called Tdap, that protects against pertussis, also known as "whooping cough," in addition to tetanus and diphtheria, may be used instead of Td.  Td may be given at the same time as other vaccines. 3. Talk with your health care provider Tell your vaccine provider if the person getting the vaccine:  Has had an allergic reaction after a previous dose of any vaccine that protects against tetanus or diphtheria, or has any severe, life-threatening allergies.  Has ever had Guillain-Barr Syndrome (also called GBS).  Has had severe pain or swelling after a previous dose of any vaccine that protects against tetanus or diphtheria. In some cases, your health care provider may decide to postpone Td vaccination to a future visit.  People with minor illnesses, such as a cold, may be vaccinated. People who are moderately or severely ill should usually wait until they recover before getting Td vaccine.  Your health care provider can give you more information. 4. Risks of a vaccine reaction  Pain, redness, or swelling where the shot was given, mild fever, headache, feeling tired, and nausea, vomiting, diarrhea, or stomachache sometimes happen after Td vaccine. People sometimes faint after medical  procedures, including vaccination. Tell your provider if you feel dizzy or have vision changes or ringing in the ears.  As with any medicine, there is a very remote chance of a vaccine causing a severe allergic reaction, other serious injury, or death. 5. What if there is a serious problem? An allergic reaction could occur after the vaccinated person leaves the clinic. If you see signs of a severe allergic reaction (hives, swelling of the face and throat, difficulty breathing, a fast heartbeat, dizziness, or weakness), call 9-1-1 and get the person to the nearest hospital.  For other signs that concern you, call your health care provider.  Adverse reactions should be reported to the Vaccine Adverse Event Reporting System (VAERS). Your health care provider will usually file this report, or you can do it yourself. Visit the VAERS website at www.vaers.hhs.gov or call 1-800-822-7967. VAERS is only for reporting reactions, and VAERS staff do not give medical advice. 6. The National Vaccine Injury Compensation Program The National Vaccine Injury Compensation Program (VICP) is a federal program that was created to compensate people who may have been injured by certain vaccines. Visit the VICP website at www.hrsa.gov/vaccinecompensation or call 1-800-338-2382 to learn about the program and about filing a claim. There is a time limit to file a claim for compensation. 7. How can I learn more?  Ask your health care provider.  Call your local or state health department.  Contact the Centers for Disease Control and Prevention (CDC): ? Call 1-800-232-4636 (1-800-CDC-INFO) or ? Visit CDC's website at www.cdc.gov/vaccines Vaccine Information Statement Td Vaccine (07/18/18) This information is not intended to replace advice given   to you by your health care provider. Make sure you discuss any questions you have with your health care provider. Document Revised: 08/27/2018 Document Reviewed: 07/30/2018 Elsevier  Patient Education  2020 Elsevier Inc. Glucose Tolerance Test During Pregnancy Why am I having this test? The glucose tolerance test (GTT) is done to check how your body processes sugar (glucose). This is one of several tests used to diagnose diabetes that develops during pregnancy (gestational diabetes mellitus). Gestational diabetes is a temporary form of diabetes that some women develop during pregnancy. It usually occurs during the second trimester of pregnancy and goes away after delivery. Testing (screening) for gestational diabetes usually occurs between 24 and 28 weeks of pregnancy. You may have the GTT test after having a 1-hour glucose screening test if the results from that test indicate that you may have gestational diabetes. You may also have this test if:  You have a history of gestational diabetes.  You have a history of giving birth to very large babies or have experienced repeated fetal loss (stillbirth).  You have signs and symptoms of diabetes, such as: ? Changes in your vision. ? Tingling or numbness in your hands or feet. ? Changes in hunger, thirst, and urination that are not otherwise explained by your pregnancy. What is being tested? This test measures the amount of glucose in your blood at different times during a period of 3 hours. This indicates how well your body is able to process glucose. What kind of sample is taken?  Blood samples are required for this test. They are usually collected by inserting a needle into a blood vessel. How do I prepare for this test?  For 3 days before your test, eat normally. Have plenty of carbohydrate-rich foods.  Follow instructions from your health care provider about: ? Eating or drinking restrictions on the day of the test. You may be asked to not eat or drink anything other than water (fast) starting 8-10 hours before the test. ? Changing or stopping your regular medicines. Some medicines may interfere with this test. Tell a  health care provider about:  All medicines you are taking, including vitamins, herbs, eye drops, creams, and over-the-counter medicines.  Any blood disorders you have.  Any surgeries you have had.  Any medical conditions you have. What happens during the test? First, your blood glucose will be measured. This is referred to as your fasting blood glucose, since you fasted before the test. Then, you will drink a glucose solution that contains a certain amount of glucose. Your blood glucose will be measured again 1, 2, and 3 hours after drinking the solution. This test takes about 3 hours to complete. You will need to stay at the testing location during this time. During the testing period:  Do not eat or drink anything other than the glucose solution.  Do not exercise.  Do not use any products that contain nicotine or tobacco, such as cigarettes and e-cigarettes. If you need help stopping, ask your health care provider. The testing procedure may vary among health care providers and hospitals. How are the results reported? Your results will be reported as milligrams of glucose per deciliter of blood (mg/dL) or millimoles per liter (mmol/L). Your health care provider will compare your results to normal ranges that were established after testing a large group of people (reference ranges). Reference ranges may vary among labs and hospitals. For this test, common reference ranges are:  Fasting: less than 95-105 mg/dL (5.3-5.8 mmol/L).  1 hour   after drinking glucose: less than 180-190 mg/dL (10.0-10.5 mmol/L).  2 hours after drinking glucose: less than 155-165 mg/dL (8.6-9.2 mmol/L).  3 hours after drinking glucose: 140-145 mg/dL (7.8-8.1 mmol/L). What do the results mean? Results within reference ranges are considered normal, meaning that your glucose levels are well-controlled. If two or more of your blood glucose levels are high, you may be diagnosed with gestational diabetes. If only one  level is high, your health care provider may suggest repeat testing or other tests to confirm a diagnosis. Talk with your health care provider about what your results mean. Questions to ask your health care provider Ask your health care provider, or the department that is doing the test:  When will my results be ready?  How will I get my results?  What are my treatment options?  What other tests do I need?  What are my next steps? Summary  The glucose tolerance test (GTT) is one of several tests used to diagnose diabetes that develops during pregnancy (gestational diabetes mellitus). Gestational diabetes is a temporary form of diabetes that some women develop during pregnancy.  You may have the GTT test after having a 1-hour glucose screening test if the results from that test indicate that you may have gestational diabetes. You may also have this test if you have any symptoms or risk factors for gestational diabetes.  Talk with your health care provider about what your results mean. This information is not intended to replace advice given to you by your health care provider. Make sure you discuss any questions you have with your health care provider. Document Revised: 07/26/2018 Document Reviewed: 11/14/2016 Elsevier Patient Education  2020 Elsevier Inc.  

## 2019-07-15 NOTE — Progress Notes (Signed)
ROB doing well. Feels good movement. Anticipatory guidance for 28 wk visit. She verbalizes and agrees. Follow up 3 wk with Marcelino Duster for ROB.   Doreene Burke, CNM

## 2019-08-05 ENCOUNTER — Encounter: Payer: Self-pay | Admitting: Certified Nurse Midwife

## 2019-08-05 ENCOUNTER — Ambulatory Visit (INDEPENDENT_AMBULATORY_CARE_PROVIDER_SITE_OTHER): Payer: BC Managed Care – PPO | Admitting: Certified Nurse Midwife

## 2019-08-05 ENCOUNTER — Other Ambulatory Visit: Payer: BC Managed Care – PPO

## 2019-08-05 ENCOUNTER — Other Ambulatory Visit: Payer: Self-pay

## 2019-08-05 VITALS — BP 100/70 | HR 101 | Wt 147.3 lb

## 2019-08-05 DIAGNOSIS — Z3A28 28 weeks gestation of pregnancy: Secondary | ICD-10-CM

## 2019-08-05 DIAGNOSIS — Z3403 Encounter for supervision of normal first pregnancy, third trimester: Secondary | ICD-10-CM

## 2019-08-05 LAB — POCT URINALYSIS DIPSTICK OB
Bilirubin, UA: NEGATIVE
Blood, UA: NEGATIVE
Glucose, UA: NEGATIVE
Leukocytes, UA: NEGATIVE
Nitrite, UA: NEGATIVE
POC,PROTEIN,UA: NEGATIVE
Spec Grav, UA: 1.015 (ref 1.010–1.025)
Urobilinogen, UA: 0.2 E.U./dL
pH, UA: 6 (ref 5.0–8.0)

## 2019-08-05 MED ORDER — TETANUS-DIPHTH-ACELL PERTUSSIS 5-2.5-18.5 LF-MCG/0.5 IM SUSP
0.5000 mL | Freq: Once | INTRAMUSCULAR | Status: AC
  Administered 2019-08-19: 16:00:00 0.5 mL via INTRAMUSCULAR

## 2019-08-05 NOTE — Progress Notes (Addendum)
ROB-Doing well, no questions or concerns. 28 week labs today, see orders. Blood transfusion consent reviewed and signed. Will consider getting TDaP at later visit. Prenatal Breastfeeding Education completed, see chart. Plans breastfeeding and condoms as contraception. Unsure about intrapartum pain management at this time, waterbirth class schedule provided along with other third trimester handouts. Anticipatory guidance regarding course of prenatal care. Reviewed red flag symptoms and when to call. RTC x 2 weeks for ROB or sooner if needed.    Wallene Dales, [facility], [employer]/ Gunnar Bulla, CNM, [facility], [facility]

## 2019-08-05 NOTE — Progress Notes (Signed)
ROB-Pt present for routine prenatal care and 28 weeks labs. Pt declined tdap vaccine.

## 2019-08-05 NOTE — Patient Instructions (Addendum)
Breastfeeding  Choosing to breastfeed is one of the best decisions you can make for yourself and your baby. A change in hormones during pregnancy causes your breasts to make breast milk in your milk-producing glands. Hormones prevent breast milk from being released before your baby is born. They also prompt milk flow after birth. Once breastfeeding has begun, thoughts of your baby, as well as his or her sucking or crying, can stimulate the release of milk from your milk-producing glands. Benefits of breastfeeding Research shows that breastfeeding offers many health benefits for infants and mothers. It also offers a cost-free and convenient way to feed your baby. For your baby  Your first milk (colostrum) helps your baby's digestive system to function better.  Special cells in your milk (antibodies) help your baby to fight off infections.  Breastfed babies are less likely to develop asthma, allergies, obesity, or type 2 diabetes. They are also at lower risk for sudden infant death syndrome (SIDS).  Nutrients in breast milk are better able to meet your baby's needs compared to infant formula.  Breast milk improves your baby's brain development. For you  Breastfeeding helps to create a very special bond between you and your baby.  Breastfeeding is convenient. Breast milk costs nothing and is always available at the correct temperature.  Breastfeeding helps to burn calories. It helps you to lose the weight that you gained during pregnancy.  Breastfeeding makes your uterus return faster to its size before pregnancy. It also slows bleeding (lochia) after you give birth.  Breastfeeding helps to lower your risk of developing type 2 diabetes, osteoporosis, rheumatoid arthritis, cardiovascular disease, and breast, ovarian, uterine, and endometrial cancer later in life. Breastfeeding basics Starting breastfeeding  Find a comfortable place to sit or lie down, with your neck and back  well-supported.  Place a pillow or a rolled-up blanket under your baby to bring him or her to the level of your breast (if you are seated). Nursing pillows are specially designed to help support your arms and your baby while you breastfeed.  Make sure that your baby's tummy (abdomen) is facing your abdomen.  Gently massage your breast. With your fingertips, massage from the outer edges of your breast inward toward the nipple. This encourages milk flow. If your milk flows slowly, you may need to continue this action during the feeding.  Support your breast with 4 fingers underneath and your thumb above your nipple (make the letter "C" with your hand). Make sure your fingers are well away from your nipple and your baby's mouth.  Stroke your baby's lips gently with your finger or nipple.  When your baby's mouth is open wide enough, quickly bring your baby to your breast, placing your entire nipple and as much of the areola as possible into your baby's mouth. The areola is the colored area around your nipple. ? More areola should be visible above your baby's upper lip than below the lower lip. ? Your baby's lips should be opened and extended outward (flanged) to ensure an adequate, comfortable latch. ? Your baby's tongue should be between his or her lower gum and your breast.  Make sure that your baby's mouth is correctly positioned around your nipple (latched). Your baby's lips should create a seal on your breast and be turned out (everted).  It is common for your baby to suck about 2-3 minutes in order to start the flow of breast milk. Latching Teaching your baby how to latch onto your breast properly is  very important. An improper latch can cause nipple pain, decreased milk supply, and poor weight gain in your baby. Also, if your baby is not latched onto your nipple properly, he or she may swallow some air during feeding. This can make your baby fussy. Burping your baby when you switch breasts  during the feeding can help to get rid of the air. However, teaching your baby to latch on properly is still the best way to prevent fussiness from swallowing air while breastfeeding. Signs that your baby has successfully latched onto your nipple  Silent tugging or silent sucking, without causing you pain. Infant's lips should be extended outward (flanged).  Swallowing heard between every 3-4 sucks once your milk has started to flow (after your let-down milk reflex occurs).  Muscle movement above and in front of his or her ears while sucking. Signs that your baby has not successfully latched onto your nipple  Sucking sounds or smacking sounds from your baby while breastfeeding.  Nipple pain. If you think your baby has not latched on correctly, slip your finger into the corner of your baby's mouth to break the suction and place it between your baby's gums. Attempt to start breastfeeding again. Signs of successful breastfeeding Signs from your baby  Your baby will gradually decrease the number of sucks or will completely stop sucking.  Your baby will fall asleep.  Your baby's body will relax.  Your baby will retain a small amount of milk in his or her mouth.  Your baby will let go of your breast by himself or herself. Signs from you  Breasts that have increased in firmness, weight, and size 1-3 hours after feeding.  Breasts that are softer immediately after breastfeeding.  Increased milk volume, as well as a change in milk consistency and color by the fifth day of breastfeeding.  Nipples that are not sore, cracked, or bleeding. Signs that your baby is getting enough milk  Wetting at least 1-2 diapers during the first 24 hours after birth.  Wetting at least 5-6 diapers every 24 hours for the first week after birth. The urine should be clear or pale yellow by the age of 5 days.  Wetting 6-8 diapers every 24 hours as your baby continues to grow and develop.  At least 3 stools in  a 24-hour period by the age of 5 days. The stool should be soft and yellow.  At least 3 stools in a 24-hour period by the age of 7 days. The stool should be seedy and yellow.  No loss of weight greater than 10% of birth weight during the first 3 days of life.  Average weight gain of 4-7 oz (113-198 g) per week after the age of 4 days.  Consistent daily weight gain by the age of 5 days, without weight loss after the age of 2 weeks. After a feeding, your baby may spit up a small amount of milk. This is normal. Breastfeeding frequency and duration Frequent feeding will help you make more milk and can prevent sore nipples and extremely full breasts (breast engorgement). Breastfeed when you feel the need to reduce the fullness of your breasts or when your baby shows signs of hunger. This is called "breastfeeding on demand." Signs that your baby is hungry include:  Increased alertness, activity, or restlessness.  Movement of the head from side to side.  Opening of the mouth when the corner of the mouth or cheek is stroked (rooting).  Increased sucking sounds, smacking lips, cooing,  sighing, or squeaking.  Hand-to-mouth movements and sucking on fingers or hands.  Fussing or crying. Avoid introducing a pacifier to your baby in the first 4-6 weeks after your baby is born. After this time, you may choose to use a pacifier. Research has shown that pacifier use during the first year of a baby's life decreases the risk of sudden infant death syndrome (SIDS). Allow your baby to feed on each breast as long as he or she wants. When your baby unlatches or falls asleep while feeding from the first breast, offer the second breast. Because newborns are often sleepy in the first few weeks of life, you may need to awaken your baby to get him or her to feed. Breastfeeding times will vary from baby to baby. However, the following rules can serve as a guide to help you make sure that your baby is properly  fed:  Newborns (babies 41 weeks of age or younger) may breastfeed every 1-3 hours.  Newborns should not go without breastfeeding for longer than 3 hours during the day or 5 hours during the night.  You should breastfeed your baby a minimum of 8 times in a 24-hour period. Breast milk pumping     Pumping and storing breast milk allows you to make sure that your baby is exclusively fed your breast milk, even at times when you are unable to breastfeed. This is especially important if you go back to work while you are still breastfeeding, or if you are not able to be present during feedings. Your lactation consultant can help you find a method of pumping that works best for you and give you guidelines about how long it is safe to store breast milk. Caring for your breasts while you breastfeed Nipples can become dry, cracked, and sore while breastfeeding. The following recommendations can help keep your breasts moisturized and healthy:  Avoid using soap on your nipples.  Wear a supportive bra designed especially for nursing. Avoid wearing underwire-style bras or extremely tight bras (sports bras).  Air-dry your nipples for 3-4 minutes after each feeding.  Use only cotton bra pads to absorb leaked breast milk. Leaking of breast milk between feedings is normal.  Use lanolin on your nipples after breastfeeding. Lanolin helps to maintain your skin's normal moisture barrier. Pure lanolin is not harmful (not toxic) to your baby. You may also hand express a few drops of breast milk and gently massage that milk into your nipples and allow the milk to air-dry. In the first few weeks after giving birth, some women experience breast engorgement. Engorgement can make your breasts feel heavy, warm, and tender to the touch. Engorgement peaks within 3-5 days after you give birth. The following recommendations can help to ease engorgement:  Completely empty your breasts while breastfeeding or pumping. You may  want to start by applying warm, moist heat (in the shower or with warm, water-soaked hand towels) just before feeding or pumping. This increases circulation and helps the milk flow. If your baby does not completely empty your breasts while breastfeeding, pump any extra milk after he or she is finished.  Apply ice packs to your breasts immediately after breastfeeding or pumping, unless this is too uncomfortable for you. To do this: ? Put ice in a plastic bag. ? Place a towel between your skin and the bag. ? Leave the ice on for 20 minutes, 2-3 times a day.  Make sure that your baby is latched on and positioned properly while breastfeeding. If  engorgement persists after 48 hours of following these recommendations, contact your health care provider or a Science writer. Overall health care recommendations while breastfeeding  Eat 3 healthy meals and 3 snacks every day. Well-nourished mothers who are breastfeeding need an additional 450-500 calories a day. You can meet this requirement by increasing the amount of a balanced diet that you eat.  Drink enough water to keep your urine pale yellow or clear.  Rest often, relax, and continue to take your prenatal vitamins to prevent fatigue, stress, and low vitamin and mineral levels in your body (nutrient deficiencies).  Do not use any products that contain nicotine or tobacco, such as cigarettes and e-cigarettes. Your baby may be harmed by chemicals from cigarettes that pass into breast milk and exposure to secondhand smoke. If you need help quitting, ask your health care provider.  Avoid alcohol.  Do not use illegal drugs or marijuana.  Talk with your health care provider before taking any medicines. These include over-the-counter and prescription medicines as well as vitamins and herbal supplements. Some medicines that may be harmful to your baby can pass through breast milk.  It is possible to become pregnant while breastfeeding. If birth  control is desired, ask your health care provider about options that will be safe while breastfeeding your baby. Where to find more information: [employer]: www.llli.org Contact a health care provider if:  You feel like you want to stop breastfeeding or have become frustrated with breastfeeding.  Your nipples are cracked or bleeding.  Your breasts are red, tender, or warm.  You have: ? Painful breasts or nipples. ? A swollen area on either breast. ? A fever or chills. ? Nausea or vomiting. ? Drainage other than breast milk from your nipples.  Your breasts do not become full before feedings by the fifth day after you give birth.  You feel sad and depressed.  Your baby is: ? Too sleepy to eat well. ? Having trouble sleeping. ? More than 22 week old and wetting fewer than 6 diapers in a 24-hour period. ? Not gaining weight by 33 days of age.  Your baby has fewer than 3 stools in a 24-hour period.  Your baby's skin or the white parts of his or her eyes become yellow. Get help right away if:  Your baby is overly tired (lethargic) and does not want to wake up and feed.  Your baby develops an unexplained fever. Summary  Breastfeeding offers many health benefits for infant and mothers.  Try to breastfeed your infant when he or she shows early signs of hunger.  Gently tickle or stroke your baby's lips with your finger or nipple to allow the baby to open his or her mouth. Bring the baby to your breast. Make sure that much of the areola is in your baby's mouth. Offer one side and burp the baby before you offer the other side.  Talk with your health care provider or lactation consultant if you have questions or you face problems as you breastfeed. This information is not intended to replace advice given to you by your health care provider. Make sure you discuss any questions you have with your health care provider. Document Revised: 06/29/2017 Document Reviewed:  05/06/2016 Elsevier Patient Education  [place].   Pain Relief During Labor and Delivery Many things can cause pain during labor and delivery, including:  Pressure on bones and ligaments due to the baby moving through the pelvis.  Stretching of tissues  due to the baby moving through the birth canal.  Muscle tension due to anxiety or nervousness.  The uterus tightening (contracting) and relaxing to help move the baby. There are many ways to deal with the pain of labor and delivery. They include:  Taking prenatal classes. Taking these classes helps you know what to expect during your baby's birth. What you learn will increase your confidence and decrease your anxiety.  Practicing relaxation techniques or doing relaxing activities, such as: ? Focused breathing. ? Meditation. ? Visualization. ? Aroma therapy. ? Listening to your favorite music. ? Hypnosis.  Taking a warm shower or bath (hydrotherapy). This may: ? Provide comfort and relaxation. ? Lessen your perception of pain. ? Decrease the amount of pain medicine needed. ? Decrease the length of labor.  Getting a massage or counterpressure on your back.  Applying warm packs or ice packs.  Changing positions often, moving around, or using a birthing ball.  Getting: ? Pain medicine through an IV or injection into a muscle. ? Pain medicine inserted into your spinal column. ? Injections of sterile water just under the skin on your lower back (intradermal injections). ? Laughing gas (nitrous oxide). Discuss your pain control options with your health care provider during your prenatal visits. Explore the options offered by your hospital or birth center. What kinds of medicine are available? There are two kinds of medicines that can be used to relieve pain during labor and delivery:  Analgesics. These medicines decrease pain without causing you to lose feeling or the ability to move your muscles.  Anesthetics. These  medicines block feeling in the body and can decrease your ability to move freely. Both of these kinds of medicine can cause minor side effects, such as nausea, trouble concentrating, and sleepiness. They can also decrease the baby's heart rate before birth and affect the baby's breathing rate after birth. For this reason, health care providers are careful about when and how much medicine is given. What are specific medicines and procedures that provide pain relief? Local Anesthetics Local anesthetics are used to numb a small area of the body. They may be used along with another kind of anesthetic or used to numb the nerves of the vagina, cervix, and perineum during the second stage of labor. General Anesthetics General anesthetics cause you to lose consciousness so you do not feel pain. They are usually only used for an emergency cesarean delivery. General anesthetics are given through an IV tube and a mask. Pudendal Block A pudendal block is a form of local anesthetic. It may be used to relieve the pain associated with pushing or stretching of the perineum at the time of delivery or to further numb the perineum. A pudendal block is done by injecting numbing medicine through the vaginal wall into a nerve in the pelvis. Epidural Analgesia Epidural analgesia is given through a flexible IV catheter that is inserted into the lower back. Numbing medicine is delivered continuously to the area near your spinal column nerves (epidural space). After having this type of analgesia, you may be able to move your legs but you most likely will not be able to walk. Depending on the amount of medicine given, you may lose all feeling in the lower half of your body, or you may retain some level of sensation, including the urge to push. Epidural analgesia can be used to provide pain relief for a vaginal birth. Spinal Block A spinal block is similar to epidural analgesia, but the medicine is  injected into the spinal fluid  instead of the epidural space. A spinal block is only given once. It starts to relieve pain quickly, but the pain relief lasts only 1-6 hours. Spinal blocks can be used for cesarean deliveries. Combined Spinal-Epidural (CSE) Block A CSE block combines the effects of a spinal block and epidural analgesia. The spinal block works quickly to block all pain. The epidural analgesia provides continuous pain relief, even after the effects of the spinal block have worn off. This information is not intended to replace advice given to you by your health care provider. Make sure you discuss any questions you have with your health care provider. Document Revised: 03/17/2017 Document Reviewed: 08/26/2015 Elsevier Patient Education  [place].   WHAT OB PATIENTS CAN EXPECT   Confirmation of pregnancy and ultrasound ordered if medically indicated-[redacted] weeks gestation  New OB (NOB) intake with nurse and New OB (NOB) labs- [redacted] weeks gestation  New OB (NOB) physical examination with provider- 11/[redacted] weeks gestation  Flu vaccine-[redacted] weeks gestation  Anatomy scan-[redacted] weeks gestation  Glucose tolerance test, blood work to test for anemia, T-dap vaccine-[redacted] weeks gestation  Vaginal swabs/cultures-STD/Group B strep-[redacted] weeks gestation  Appointments every 4 weeks until 28 weeks  Every 2 weeks from 28 weeks until 36 weeks  Weekly visits from 36 weeks until delivery    https://www.cdc.gov/vaccines/hcp/vis/vis-statements/tdap.pdf">  Tdap (Tetanus, Diphtheria, Pertussis) Vaccine: What You Need to Know 1. Why get vaccinated? Tdap vaccine can prevent tetanus, diphtheria, and pertussis. Diphtheria and pertussis spread from person to person. Tetanus enters the body through cuts or wounds.  TETANUS (T) causes painful stiffening of the muscles. Tetanus can lead to serious health problems, including being unable to open the mouth, having trouble swallowing and breathing, or death.  DIPHTHERIA (D) can lead to  difficulty breathing, heart failure, paralysis, or death.  PERTUSSIS (aP), also known as "whooping cough," can cause uncontrollable, violent coughing which makes it hard to breathe, eat, or drink. Pertussis can be extremely serious in babies and young children, causing pneumonia, convulsions, brain damage, or death. In teens and adults, it can cause weight loss, loss of bladder control, passing out, and rib fractures from severe coughing. 2. Tdap vaccine Tdap is only for children 7 years and older, adolescents, and adults.  Adolescents should receive a single dose of Tdap, preferably at age 77 or 4 years. Pregnant women should get a dose of Tdap during every pregnancy, to protect the newborn from pertussis. Infants are most at risk for severe, life-threatening complications from pertussis. Adults who have never received Tdap should get a dose of Tdap. Also, adults should receive a booster dose every 10 years, or earlier in the case of a severe and dirty wound or burn. Booster doses can be either Tdap or Td (a different vaccine that protects against tetanus and diphtheria but not pertussis). Tdap may be given at the same time as other vaccines. 3. Talk with your health care provider Tell your vaccine provider if the person getting the vaccine:  Has had an allergic reaction after a previous dose of any vaccine that protects against tetanus, diphtheria, or pertussis, or has any severe, life-threatening allergies.  Has had a coma, decreased level of consciousness, or prolonged seizures within 7 days after a previous dose of any pertussis vaccine (DTP, DTaP, or Tdap).  Has seizures or another nervous system problem.  Has ever had Guillain-Barr Syndrome (also called GBS).  Has had severe pain or swelling after a previous dose of  any vaccine that protects against tetanus or diphtheria. In some cases, your health care provider may decide to postpone Tdap vaccination to a future visit.  People with  minor illnesses, such as a cold, may be vaccinated. People who are moderately or severely ill should usually wait until they recover before getting Tdap vaccine.  Your health care provider can give you more information. 4. Risks of a vaccine reaction  Pain, redness, or swelling where the shot was given, mild fever, headache, feeling tired, and nausea, vomiting, diarrhea, or stomachache sometimes happen after Tdap vaccine. People sometimes faint after medical procedures, including vaccination. Tell your provider if you feel dizzy or have vision changes or ringing in the ears.  As with any medicine, there is a very remote chance of a vaccine causing a severe allergic reaction, other serious injury, or death. 5. What if there is a serious problem? An allergic reaction could occur after the vaccinated person leaves the clinic. If you see signs of a severe allergic reaction (hives, swelling of the face and throat, difficulty breathing, a fast heartbeat, dizziness, or weakness), call 9-1-1 and get the person to the nearest hospital. For other signs that concern you, call your health care provider.  Adverse reactions should be reported to the Vaccine Adverse Event Reporting System (VAERS). Your health care provider will usually file this report, or you can do it yourself. Visit the VAERS website at www.vaers.SamedayNews.es or call 775-390-9788. VAERS is only for reporting reactions, and VAERS staff do not give medical advice. 6. The National Vaccine Injury Compensation Program The [employer] Vaccine Injury Compensation Program (VICP) is a federal program that was created to compensate people who may have been injured by certain vaccines. Visit the VICP website at GoldCloset.com.ee or call 726-709-5819 to learn about the program and about filing a claim. There is a time limit to file a claim for compensation. 7. How can I learn more?  Ask your health care provider.  Call your local or state health  department.  Contact the Centers for Disease Control and Prevention (CDC): ? Call 307-045-0054 (1-800-CDC-INFO) or ? Visit CDC's website at http://hunter.com/ Vaccine Information Statement Tdap (Tetanus, Diphtheria, Pertussis) Vaccine (07/18/2018) This information is not intended to replace advice given to you by your health care provider. Make sure you discuss any questions you have with your health care provider. Document Revised: 07/27/2018 Document Reviewed: 07/30/2018 Elsevier Patient Education  [place] of Pregnancy  The third trimester is from week 28 through week 40 (months 7 through 9). This trimester is when your unborn baby (fetus) is growing very fast. At the end of the ninth month, the unborn baby is about 20 inches in length. It weighs about 6-10 pounds. Follow these instructions at home: Medicines  Take over-the-counter and prescription medicines only as told by your doctor. Some medicines are safe and some medicines are not safe during pregnancy.  Take a prenatal vitamin that contains at least 600 micrograms (mcg) of folic acid.  If you have trouble pooping (constipation), take medicine that will make your stool soft (stool softener) if your doctor approves. Eating and drinking   Eat regular, healthy meals.  Avoid raw meat and uncooked cheese.  If you get low calcium from the food you eat, talk to your doctor about taking a daily calcium supplement.  Eat four or five small meals rather than three large meals a day.  Avoid foods that are high in fat and sugars, such as fried and  sweet foods.  To prevent constipation: ? Eat foods that are high in fiber, like fresh fruits and vegetables, whole grains, and beans. ? Drink enough fluids to keep your pee (urine) clear or pale yellow. Activity  Exercise only as told by your doctor. Stop exercising if you start to have cramps.  Avoid heavy lifting, wear low heels, and sit up  straight.  Do not exercise if it is too hot, too humid, or if you are in a place of great height (high altitude).  You may continue to have sex unless your doctor tells you not to. Relieving pain and discomfort  Wear a good support bra if your breasts are tender.  Take frequent breaks and rest with your legs raised if you have leg cramps or low back pain.  Take warm water baths (sitz baths) to soothe pain or discomfort caused by hemorrhoids. Use hemorrhoid cream if your doctor approves.  If you develop puffy, bulging veins (varicose veins) in your legs: ? Wear support hose or compression stockings as told by your doctor. ? Raise (elevate) your feet for 15 minutes, 3-4 times a day. ? Limit salt in your food. Safety  Wear your seat belt when driving.  Make a list of emergency phone numbers, including numbers for family, friends, the hospital, and police and fire departments. Preparing for your baby's arrival To prepare for the arrival of your baby:  Take prenatal classes.  Practice driving to the hospital.  Visit the hospital and tour the maternity area.  Talk to your work about taking leave once the baby comes.  Pack your hospital bag.  Prepare the baby's room.  Go to your doctor visits.  Buy a rear-facing car seat. Learn how to install it in your car. General instructions  Do not use hot tubs, steam rooms, or saunas.  Do not use any products that contain nicotine or tobacco, such as cigarettes and e-cigarettes. If you need help quitting, ask your doctor.  Do not drink alcohol.  Do not douche or use tampons or scented sanitary pads.  Do not cross your legs for long periods of time.  Do not travel for long distances unless you must. Only do so if your doctor says it is okay.  Visit your dentist if you have not gone during your pregnancy. Use a soft toothbrush to brush your teeth. Be gentle when you floss.  Avoid cat litter boxes and soil used by cats. These carry  germs that can cause birth defects in the baby and can cause a loss of your baby (miscarriage) or stillbirth.  Keep all your prenatal visits as told by your doctor. This is important. Contact a doctor if:  You are not sure if you are in labor or if your water has broken.  You are dizzy.  You have mild cramps or pressure in your lower belly.  You have a nagging pain in your belly area.  You continue to feel sick to your stomach, you throw up, or you have watery poop.  You have bad smelling fluid coming from your vagina.  You have pain when you pee. Get help right away if:  You have a fever.  You are leaking fluid from your vagina.  You are spotting or bleeding from your vagina.  You have severe belly cramps or pain.  You lose or gain weight quickly.  You have trouble catching your breath and have chest pain.  You notice sudden or extreme puffiness (swelling) of your face,  hands, ankles, feet, or legs.  You have not felt the baby move in over an hour.  You have severe headaches that do not go away with medicine.  You have trouble seeing.  You are leaking, or you are having a gush of fluid, from your vagina before you are 37 weeks.  You have regular belly spasms (contractions) before you are 37 weeks. Summary  The third trimester is from week 28 through week 40 (months 7 through 9). This time is when your unborn baby is growing very fast.  Follow your doctor's advice about medicine, food, and activity.  Get ready for the arrival of your baby by taking prenatal classes, getting all the baby items ready, preparing the baby's room, and visiting your doctor to be checked.  Get help right away if you are bleeding from your vagina, or you have chest pain and trouble catching your breath, or if you have not felt your baby move in over an hour. This information is not intended to replace advice given to you by your health care provider. Make sure you discuss any questions you  have with your health care provider. Document Revised: 07/26/2018 Document Reviewed: 05/10/2016 Elsevier Patient Education  [employer]. Common Medications Safe in Pregnancy  Acne:      Constipation:  Benzoyl Peroxide     Colace  Clindamycin      Dulcolax Suppository  Topica Erythromycin     Fibercon  Salicylic Acid      Metamucil         Miralax AVOID:        Senakot   Accutane    Cough:  Retin-A       Cough Drops  Tetracycline      Phenergan w/ Codeine if Rx  Minocycline      Robitussin (Plain & DM)  Antibiotics:     Crabs/Lice:  Ceclor       RID  Cephalosporins    AVOID:  E-Mycins      Kwell  Keflex  Macrobid/Macrodantin   Diarrhea:  Penicillin      Kao-Pectate  Zithromax      Imodium AD         PUSH FLUIDS AVOID:       Cipro     Fever:  Tetracycline      Tylenol (Regular or Extra  Minocycline       Strength)  Levaquin      Extra Strength-Do not          Exceed 8 tabs/24 hrs Caffeine:        '200mg'$ /day (equiv. To 1 cup of coffee or  approx. 3 12 oz sodas)         Gas: Cold/Hayfever:       Gas-X  Benadryl      Mylicon  Claritin       Phazyme  **Claritin-D        Chlor-Trimeton    Headaches:  Dimetapp      ASA-Free Excedrin  Drixoral-Non-Drowsy     Cold Compress  Mucinex (Guaifenasin)     Tylenol (Regular or Extra  Sudafed/Sudafed-12 Hour     Strength)  **Sudafed PE Pseudoephedrine   Tylenol Cold & Sinus     Vicks Vapor Rub  Zyrtec  **AVOID if Problems With Blood Pressure         Heartburn: Avoid lying down for at least 1 hour after meals  Aciphex      Maalox     Rash:  Milk of Magnesia     Benadryl    Mylanta       1% Hydrocortisone Cream  Pepcid  Pepcid Complete   Sleep Aids:  Prevacid      Ambien   Prilosec       Benadryl  Rolaids       Chamomile Tea  Tums (Limit 4/day)     Unisom  Zantac       Tylenol PM         Warm milk-add vanilla or  Hemorrhoids:       Sugar for taste  Anusol/Anusol H.C.  (RX: Analapram 2.5%)  Sugar  Substitutes:  Hydrocortisone OTC     Ok in moderation  Preparation H      Tucks        Vaseline lotion applied to tissue with wiping    Herpes:     Throat:  Acyclovir      Oragel  Famvir  Valtrex     Vaccines:         Flu Shot Leg Cramps:       *Gardasil  Benadryl      Hepatitis A         Hepatitis B Nasal Spray:       Pneumovax  Saline Nasal Spray     Polio Booster         Tetanus Nausea:       Tuberculosis test or PPD  Vitamin B6 25 mg TID   AVOID:    Dramamine      *Gardasil  Emetrol       Live Poliovirus  Ginger Root 250 mg QID    MMR (measles, mumps &  High Complex Carbs @ Bedtime    rebella)  Sea Bands-Accupressure    Varicella (Chickenpox)  Unisom 1/2 tab TID     *No known complications           If received before Pain:         Known pregnancy;   Darvocet       Resume series after  Lortab        Delivery  Percocet    Yeast:   Tramadol      Femstat  Tylenol 3      Gyne-lotrimin  Ultram       Monistat  Vicodin           MISC:         All Sunscreens           Hair Coloring/highlights          Insect Repellant's          (Including DEET)         Mystic Tans Breastfeeding  Choosing to breastfeed is one of the best decisions you can make for yourself and your baby. A change in hormones during pregnancy causes your breasts to make breast milk in your milk-producing glands. Hormones prevent breast milk from being released before your baby is born. They also prompt milk flow after birth. Once breastfeeding has begun, thoughts of your baby, as well as his or her sucking or crying, can stimulate the release of milk from your milk-producing glands. Benefits of breastfeeding Research shows that breastfeeding offers many health benefits for infants and mothers. It also offers a cost-free and convenient way to feed your baby. For your baby  Your first milk (colostrum) helps your baby's digestive system to function better.  Special cells in your milk (antibodies) help your  baby to fight off infections.  Breastfed babies are less likely to develop asthma, allergies, obesity, or type 2 diabetes. They are also at lower risk for sudden infant death syndrome (SIDS).  Nutrients in breast milk are better able to meet your baby's needs compared to infant formula.  Breast milk improves your baby's brain development. For you  Breastfeeding helps to create a very special bond between you and your baby.  Breastfeeding is convenient. Breast milk costs nothing and is always available at the correct temperature.  Breastfeeding helps to burn calories. It helps you to lose the weight that you gained during pregnancy.  Breastfeeding makes your uterus return faster to its size before pregnancy. It also slows bleeding (lochia) after you give birth.  Breastfeeding helps to lower your risk of developing type 2 diabetes, osteoporosis, rheumatoid arthritis, cardiovascular disease, and breast, ovarian, uterine, and endometrial cancer later in life. Breastfeeding basics Starting breastfeeding  Find a comfortable place to sit or lie down, with your neck and back well-supported.  Place a pillow or a rolled-up blanket under your baby to bring him or her to the level of your breast (if you are seated). Nursing pillows are specially designed to help support your arms and your baby while you breastfeed.  Make sure that your baby's tummy (abdomen) is facing your abdomen.  Gently massage your breast. With your fingertips, massage from the outer edges of your breast inward toward the nipple. This encourages milk flow. If your milk flows slowly, you may need to continue this action during the feeding.  Support your breast with 4 fingers underneath and your thumb above your nipple (make the letter "C" with your hand). Make sure your fingers are well away from your nipple and your baby's mouth.  Stroke your baby's lips gently with your finger or nipple.  When your baby's mouth is open wide  enough, quickly bring your baby to your breast, placing your entire nipple and as much of the areola as possible into your baby's mouth. The areola is the colored area around your nipple. ? More areola should be visible above your baby's upper lip than below the lower lip. ? Your baby's lips should be opened and extended outward (flanged) to ensure an adequate, comfortable latch. ? Your baby's tongue should be between his or her lower gum and your breast.  Make sure that your baby's mouth is correctly positioned around your nipple (latched). Your baby's lips should create a seal on your breast and be turned out (everted).  It is common for your baby to suck about 2-3 minutes in order to start the flow of breast milk. Latching Teaching your baby how to latch onto your breast properly is very important. An improper latch can cause nipple pain, decreased milk supply, and poor weight gain in your baby. Also, if your baby is not latched onto your nipple properly, he or she may swallow some air during feeding. This can make your baby fussy. Burping your baby when you switch breasts during the feeding can help to get rid of the air. However, teaching your baby to latch on properly is still the best way to prevent fussiness from swallowing air while breastfeeding. Signs that your baby has successfully latched onto your nipple  Silent tugging or silent sucking, without causing you pain. Infant's lips should be extended outward (flanged).  Swallowing heard between every 3-4 sucks once your milk has started to flow (after your let-down milk reflex occurs).  Muscle movement  above and in front of his or her ears while sucking. Signs that your baby has not successfully latched onto your nipple  Sucking sounds or smacking sounds from your baby while breastfeeding.  Nipple pain. If you think your baby has not latched on correctly, slip your finger into the corner of your baby's mouth to break the suction and  place it between your baby's gums. Attempt to start breastfeeding again. Signs of successful breastfeeding Signs from your baby  Your baby will gradually decrease the number of sucks or will completely stop sucking.  Your baby will fall asleep.  Your baby's body will relax.  Your baby will retain a small amount of milk in his or her mouth.  Your baby will let go of your breast by himself or herself. Signs from you  Breasts that have increased in firmness, weight, and size 1-3 hours after feeding.  Breasts that are softer immediately after breastfeeding.  Increased milk volume, as well as a change in milk consistency and color by the fifth day of breastfeeding.  Nipples that are not sore, cracked, or bleeding. Signs that your baby is getting enough milk  Wetting at least 1-2 diapers during the first 24 hours after birth.  Wetting at least 5-6 diapers every 24 hours for the first week after birth. The urine should be clear or pale yellow by the age of 5 days.  Wetting 6-8 diapers every 24 hours as your baby continues to grow and develop.  At least 3 stools in a 24-hour period by the age of 5 days. The stool should be soft and yellow.  At least 3 stools in a 24-hour period by the age of 7 days. The stool should be seedy and yellow.  No loss of weight greater than 10% of birth weight during the first 3 days of life.  Average weight gain of 4-7 oz (113-198 g) per week after the age of 4 days.  Consistent daily weight gain by the age of 5 days, without weight loss after the age of 2 weeks. After a feeding, your baby may spit up a small amount of milk. This is normal. Breastfeeding frequency and duration Frequent feeding will help you make more milk and can prevent sore nipples and extremely full breasts (breast engorgement). Breastfeed when you feel the need to reduce the fullness of your breasts or when your baby shows signs of hunger. This is called "breastfeeding on demand."  Signs that your baby is hungry include:  Increased alertness, activity, or restlessness.  Movement of the head from side to side.  Opening of the mouth when the corner of the mouth or cheek is stroked (rooting).  Increased sucking sounds, smacking lips, cooing, sighing, or squeaking.  Hand-to-mouth movements and sucking on fingers or hands.  Fussing or crying. Avoid introducing a pacifier to your baby in the first 4-6 weeks after your baby is born. After this time, you may choose to use a pacifier. Research has shown that pacifier use during the first year of a baby's life decreases the risk of sudden infant death syndrome (SIDS). Allow your baby to feed on each breast as long as he or she wants. When your baby unlatches or falls asleep while feeding from the first breast, offer the second breast. Because newborns are often sleepy in the first few weeks of life, you may need to awaken your baby to get him or her to feed. Breastfeeding times will vary from baby to baby. However, the  following rules can serve as a guide to help you make sure that your baby is properly fed:  Newborns (babies 77 weeks of age or younger) may breastfeed every 1-3 hours.  Newborns should not go without breastfeeding for longer than 3 hours during the day or 5 hours during the night.  You should breastfeed your baby a minimum of 8 times in a 24-hour period. Breast milk pumping     Pumping and storing breast milk allows you to make sure that your baby is exclusively fed your breast milk, even at times when you are unable to breastfeed. This is especially important if you go back to work while you are still breastfeeding, or if you are not able to be present during feedings. Your lactation consultant can help you find a method of pumping that works best for you and give you guidelines about how long it is safe to store breast milk. Caring for your breasts while you breastfeed Nipples can become dry, cracked, and  sore while breastfeeding. The following recommendations can help keep your breasts moisturized and healthy:  Avoid using soap on your nipples.  Wear a supportive bra designed especially for nursing. Avoid wearing underwire-style bras or extremely tight bras (sports bras).  Air-dry your nipples for 3-4 minutes after each feeding.  Use only cotton bra pads to absorb leaked breast milk. Leaking of breast milk between feedings is normal.  Use lanolin on your nipples after breastfeeding. Lanolin helps to maintain your skin's normal moisture barrier. Pure lanolin is not harmful (not toxic) to your baby. You may also hand express a few drops of breast milk and gently massage that milk into your nipples and allow the milk to air-dry. In the first few weeks after giving birth, some women experience breast engorgement. Engorgement can make your breasts feel heavy, warm, and tender to the touch. Engorgement peaks within 3-5 days after you give birth. The following recommendations can help to ease engorgement:  Completely empty your breasts while breastfeeding or pumping. You may want to start by applying warm, moist heat (in the shower or with warm, water-soaked hand towels) just before feeding or pumping. This increases circulation and helps the milk flow. If your baby does not completely empty your breasts while breastfeeding, pump any extra milk after he or she is finished.  Apply ice packs to your breasts immediately after breastfeeding or pumping, unless this is too uncomfortable for you. To do this: ? Put ice in a plastic bag. ? Place a towel between your skin and the bag. ? Leave the ice on for 20 minutes, 2-3 times a day.  Make sure that your baby is latched on and positioned properly while breastfeeding. If engorgement persists after 48 hours of following these recommendations, contact your health care provider or a Science writer. Overall health care recommendations while  breastfeeding  Eat 3 healthy meals and 3 snacks every day. Well-nourished mothers who are breastfeeding need an additional 450-500 calories a day. You can meet this requirement by increasing the amount of a balanced diet that you eat.  Drink enough water to keep your urine pale yellow or clear.  Rest often, relax, and continue to take your prenatal vitamins to prevent fatigue, stress, and low vitamin and mineral levels in your body (nutrient deficiencies).  Do not use any products that contain nicotine or tobacco, such as cigarettes and e-cigarettes. Your baby may be harmed by chemicals from cigarettes that pass into breast milk and exposure to secondhand  smoke. If you need help quitting, ask your health care provider.  Avoid alcohol.  Do not use illegal drugs or marijuana.  Talk with your health care provider before taking any medicines. These include over-the-counter and prescription medicines as well as vitamins and herbal supplements. Some medicines that may be harmful to your baby can pass through breast milk.  It is possible to become pregnant while breastfeeding. If birth control is desired, ask your health care provider about options that will be safe while breastfeeding your baby. Where to find more information: [employer]: www.llli.org Contact a health care provider if:  You feel like you want to stop breastfeeding or have become frustrated with breastfeeding.  Your nipples are cracked or bleeding.  Your breasts are red, tender, or warm.  You have: ? Painful breasts or nipples. ? A swollen area on either breast. ? A fever or chills. ? Nausea or vomiting. ? Drainage other than breast milk from your nipples.  Your breasts do not become full before feedings by the fifth day after you give birth.  You feel sad and depressed.  Your baby is: ? Too sleepy to eat well. ? Having trouble sleeping. ? More than 33 week old and wetting fewer than 6 diapers in a  24-hour period. ? Not gaining weight by 56 days of age.  Your baby has fewer than 3 stools in a 24-hour period.  Your baby's skin or the white parts of his or her eyes become yellow. Get help right away if:  Your baby is overly tired (lethargic) and does not want to wake up and feed.  Your baby develops an unexplained fever. Summary  Breastfeeding offers many health benefits for infant and mothers.  Try to breastfeed your infant when he or she shows early signs of hunger.  Gently tickle or stroke your baby's lips with your finger or nipple to allow the baby to open his or her mouth. Bring the baby to your breast. Make sure that much of the areola is in your baby's mouth. Offer one side and burp the baby before you offer the other side.  Talk with your health care provider or lactation consultant if you have questions or you face problems as you breastfeed. This information is not intended to replace advice given to you by your health care provider. Make sure you discuss any questions you have with your health care provider. Document Revised: 06/29/2017 Document Reviewed: 05/06/2016 Elsevier Patient Education  [place].

## 2019-08-06 ENCOUNTER — Encounter: Payer: Self-pay | Admitting: Certified Nurse Midwife

## 2019-08-06 DIAGNOSIS — Z6730 Type AB blood, Rh positive: Secondary | ICD-10-CM | POA: Insufficient documentation

## 2019-08-06 DIAGNOSIS — O09899 Supervision of other high risk pregnancies, unspecified trimester: Secondary | ICD-10-CM | POA: Insufficient documentation

## 2019-08-06 DIAGNOSIS — Z2839 Other underimmunization status: Secondary | ICD-10-CM | POA: Insufficient documentation

## 2019-08-06 DIAGNOSIS — Z283 Underimmunization status: Secondary | ICD-10-CM | POA: Insufficient documentation

## 2019-08-06 LAB — CBC
Hematocrit: 37.2 % (ref 34.0–46.6)
Hemoglobin: 12.7 g/dL (ref 11.1–15.9)
MCH: 33.2 pg — ABNORMAL HIGH (ref 26.6–33.0)
MCHC: 34.1 g/dL (ref 31.5–35.7)
MCV: 97 fL (ref 79–97)
Platelets: 232 10*3/uL (ref 150–450)
RBC: 3.82 x10E6/uL (ref 3.77–5.28)
RDW: 12.4 % (ref 11.7–15.4)
WBC: 7.4 10*3/uL (ref 3.4–10.8)

## 2019-08-06 LAB — RPR: RPR Ser Ql: NONREACTIVE

## 2019-08-06 LAB — GLUCOSE, 1 HOUR GESTATIONAL: Gestational Diabetes Screen: 122 mg/dL (ref 65–139)

## 2019-08-19 ENCOUNTER — Ambulatory Visit (INDEPENDENT_AMBULATORY_CARE_PROVIDER_SITE_OTHER): Payer: BC Managed Care – PPO | Admitting: Certified Nurse Midwife

## 2019-08-19 ENCOUNTER — Other Ambulatory Visit: Payer: Self-pay

## 2019-08-19 ENCOUNTER — Encounter: Payer: Self-pay | Admitting: Certified Nurse Midwife

## 2019-08-19 VITALS — BP 113/76 | HR 85 | Wt 153.3 lb

## 2019-08-19 DIAGNOSIS — Z23 Encounter for immunization: Secondary | ICD-10-CM

## 2019-08-19 DIAGNOSIS — Z3A30 30 weeks gestation of pregnancy: Secondary | ICD-10-CM

## 2019-08-19 LAB — POCT URINALYSIS DIPSTICK OB
Bilirubin, UA: NEGATIVE
Blood, UA: NEGATIVE
Glucose, UA: NEGATIVE
Ketones, UA: NEGATIVE
Leukocytes, UA: NEGATIVE
Nitrite, UA: NEGATIVE
POC,PROTEIN,UA: NEGATIVE
Spec Grav, UA: 1.02 (ref 1.010–1.025)
Urobilinogen, UA: 0.2 E.U./dL
pH, UA: 5 (ref 5.0–8.0)

## 2019-08-19 MED ORDER — TETANUS-DIPHTH-ACELL PERTUSSIS 5-2.5-18.5 LF-MCG/0.5 IM SUSP: 0.5000 mL | Freq: Once | INTRAMUSCULAR | Status: DC

## 2019-08-19 NOTE — Progress Notes (Signed)
ROB doing well. Feels good movement. RSB discussed. See pregnancy check list for topics reviewed. Follow up 2 wk with Marcelino Duster.   Doreene Burke, CNM

## 2019-08-19 NOTE — Patient Instructions (Signed)
[facility]  [facility]  [address], [place], [place] [address]  Phone: [phone]  [facility] (second location)  [address] [place], [place] [address]  Phone: [phone]  [facility] ([place]) [address], [place], [place] [address] Phone: [phone]  [facility]  [address] [place], [place] [address]  Phone: [phone] 

## 2019-09-02 ENCOUNTER — Other Ambulatory Visit: Payer: Self-pay

## 2019-09-02 ENCOUNTER — Ambulatory Visit (INDEPENDENT_AMBULATORY_CARE_PROVIDER_SITE_OTHER): Payer: BC Managed Care – PPO | Admitting: Certified Nurse Midwife

## 2019-09-02 ENCOUNTER — Encounter: Payer: Self-pay | Admitting: Certified Nurse Midwife

## 2019-09-02 VITALS — BP 113/84 | HR 103 | Wt 157.4 lb

## 2019-09-02 DIAGNOSIS — Z3A32 32 weeks gestation of pregnancy: Secondary | ICD-10-CM

## 2019-09-02 DIAGNOSIS — Z3403 Encounter for supervision of normal first pregnancy, third trimester: Secondary | ICD-10-CM

## 2019-09-02 LAB — POCT URINALYSIS DIPSTICK OB
Bilirubin, UA: NEGATIVE
Blood, UA: NEGATIVE
Glucose, UA: NEGATIVE
Leukocytes, UA: NEGATIVE
Nitrite, UA: NEGATIVE
POC,PROTEIN,UA: NEGATIVE
Spec Grav, UA: 1.01 (ref 1.010–1.025)
Urobilinogen, UA: 0.2 E.U./dL
pH, UA: 6 (ref 5.0–8.0)

## 2019-09-02 NOTE — Patient Instructions (Signed)
Fetal Movement Counts Patient Name: ________________________________________________ Patient Due Date: ____________________ What is a fetal movement count?  A fetal movement count is the number of times that you feel your baby move during a certain amount of time. This may also be called a fetal kick count. A fetal movement count is recommended for every pregnant woman. You may be asked to start counting fetal movements as early as week 28 of your pregnancy. Pay attention to when your baby is most active. You may notice your baby's sleep and wake cycles. You may also notice things that make your baby move more. You should do a fetal movement count:  When your baby is normally most active.  At the same time each day. A good time to count movements is while you are resting, after having something to eat and drink. How do I count fetal movements? 1. Find a quiet, comfortable area. Sit, or lie down on your side. 2. Write down the date, the start time and stop time, and the number of movements that you felt between those two times. Take this information with you to your health care visits. 3. Write down your start time when you feel the first movement. 4. Count kicks, flutters, swishes, rolls, and jabs. You should feel at least 10 movements. 5. You may stop counting after you have felt 10 movements, or if you have been counting for 2 hours. Write down the stop time. 6. If you do not feel 10 movements in 2 hours, contact your health care provider for further instructions. Your health care provider may want to do additional tests to assess your baby's well-being. Contact a health care provider if:  You feel fewer than 10 movements in 2 hours.  Your baby is not moving like he or she usually does. Date: ____________ Start time: ____________ Stop time: ____________ Movements: ____________ Date: ____________ Start time: ____________ Stop time: ____________ Movements: ____________ Date: ____________  Start time: ____________ Stop time: ____________ Movements: ____________ Date: ____________ Start time: ____________ Stop time: ____________ Movements: ____________ Date: ____________ Start time: ____________ Stop time: ____________ Movements: ____________ Date: ____________ Start time: ____________ Stop time: ____________ Movements: ____________ Date: ____________ Start time: ____________ Stop time: ____________ Movements: ____________ Date: ____________ Start time: ____________ Stop time: ____________ Movements: ____________ Date: ____________ Start time: ____________ Stop time: ____________ Movements: ____________ This information is not intended to replace advice given to you by your health care provider. Make sure you discuss any questions you have with your health care provider. Document Revised: 11/22/2018 Document Reviewed: 11/22/2018 Elsevier Patient Education  2020 [employer]  

## 2019-09-02 NOTE — Progress Notes (Signed)
Patient comes in today for ROB visit. No concerns today. 

## 2019-09-02 NOTE — Progress Notes (Signed)
I have seen, interviewed, and examined the patient in conjunction with the [employer] [facility] Nurse Practitioner student and affirm the diagnosis and management plan.   Starlee Corralejo Michelle Abigal Choung, CNM [facility] 09/02/19 4:50 PM  

## 2019-09-02 NOTE — Progress Notes (Signed)
ROB- doing well. No questions or concerns today. Encouraged patient to increase water intake to 80-100 oz daily and increase protein. Reviewed thirty-six (36) week cultures with patients. Anticipatory guidance given. Reviewed red flags and when to call the office.  RTC x two (2) weeks for ROB or sooner if needed.   Glorious Peach RN [facility] [employer] 09/02/19 4:17 PM

## 2019-09-17 ENCOUNTER — Ambulatory Visit (INDEPENDENT_AMBULATORY_CARE_PROVIDER_SITE_OTHER): Payer: BC Managed Care – PPO | Admitting: Certified Nurse Midwife

## 2019-09-17 ENCOUNTER — Other Ambulatory Visit: Payer: Self-pay

## 2019-09-17 VITALS — BP 120/74 | HR 89 | Wt 160.2 lb

## 2019-09-17 DIAGNOSIS — Z3A34 34 weeks gestation of pregnancy: Secondary | ICD-10-CM | POA: Diagnosis not present

## 2019-09-17 LAB — POCT URINALYSIS DIPSTICK OB
Bilirubin, UA: NEGATIVE
Blood, UA: NEGATIVE
Glucose, UA: NEGATIVE
Leukocytes, UA: NEGATIVE
Nitrite, UA: NEGATIVE
POC,PROTEIN,UA: NEGATIVE
Spec Grav, UA: 1.01 (ref 1.010–1.025)
Urobilinogen, UA: 0.2 E.U./dL
pH, UA: 5 (ref 5.0–8.0)

## 2019-09-17 NOTE — Progress Notes (Signed)
ROB doing well. Feels good movement. Discussed GBS testing next visit. Packing list for L&D given. Discussed herbal prep handout to be given next visit. She verlbaizes and agrees . Follow up 2 wk with Marcelino Duster.   Doreene Burke, CNM

## 2019-09-17 NOTE — Patient Instructions (Signed)
Group B Streptococcus Infection During Pregnancy °Group B Streptococcus (GBS) is a type of bacteria that is often found in healthy people. It is commonly found in the rectum, vagina, and intestines. In people who are healthy and not pregnant, the bacteria rarely cause serious illness or complications. However, women who test positive for GBS during pregnancy can pass the bacteria to the baby during childbirth. This can cause serious infection in the baby after birth. °Women with GBS may also have infections during their pregnancy or soon after childbirth. The infections include urinary tract infections (UTIs) or infections of the uterus. GBS also increases a woman's risk of complications during pregnancy, such as early labor or delivery, miscarriage, or stillbirth. Routine testing for GBS is recommended for all pregnant women. °What are the causes? °This condition is caused by bacteria called Streptococcus agalactiae. °What increases the risk? °You may have a higher risk for GBS infection during pregnancy if you had one during a past pregnancy. °What are the signs or symptoms? °In most cases, GBS infection does not cause symptoms in pregnant women. If symptoms exist, they may include: °· Labor that starts before the 37th week of pregnancy. °· A UTI or bladder infection. This may cause a fever, frequent urination, or pain and burning during urination. °· Fever during labor. There can also be a rapid heartbeat in the mother or baby. °Rare but serious symptoms of a GBS infection in women include: °· Blood infection (septicemia). This may cause fever, chills, or confusion. °· Lung infection (pneumonia). This may cause fever, chills, cough, rapid breathing, chest pain, or difficulty breathing. °· Bone, joint, skin, or soft tissue infection. °How is this diagnosed? °You may be screened for GBS between week 35 and week 37 of pregnancy. If you have symptoms of preterm labor, you may be screened earlier. This condition is  diagnosed based on lab test results from: °· A swab of fluid from the vagina and rectum. °· A urine sample. °How is this treated? °This condition is treated with antibiotic medicine. Antibiotic medicine may be given: °· To you when you go into labor, or as soon as your water breaks. The medicines will continue until after you give birth. If you are having a cesarean delivery, you do not need antibiotics unless your water has broken. °· To your baby, if he or she requires treatment. Your health care provider will check your baby to decide if he or she needs antibiotics to prevent a serious infection. °Follow these instructions at home: °· Take over-the-counter and prescription medicines only as told by your health care provider. °· Take your antibiotic medicine as told by your health care provider. Do not stop taking the antibiotic even if you start to feel better. °· Keep all pre-birth (prenatal) visits and follow-up visits as told by your health care provider. This is important. °Contact a health care provider if: °· You have pain or burning when you urinate. °· You have to urinate more often than usual. °· You have a fever or chills. °· You develop a bad-smelling vaginal discharge. °Get help right away if: °· Your water breaks. °· You go into labor. °· You have severe pain in your abdomen. °· You have difficulty breathing. °· You have chest pain. °These symptoms may represent a serious problem that is an emergency. Do not wait to see if the symptoms will go away. Get medical help right away. Call your local emergency services (911 in the [place] Do not drive yourself to   the hospital. °Summary °· GBS is a type of bacteria that is common in healthy people. °· During pregnancy, colonization with GBS can cause serious complications for you or your baby. °· Your health care provider will screen you between 35 and 37 weeks of pregnancy to determine if you are colonized with GBS. °· If you are colonized with GBS during  pregnancy, your health care provider will recommend antibiotics through an IV during labor. °· After delivery, your baby will be evaluated for complications related to potential GBS infection and may require antibiotics to prevent a serious infection. °This information is not intended to replace advice given to you by your health care provider. Make sure you discuss any questions you have with your health care provider. °Document Revised: 10/29/2018 Document Reviewed: 10/29/2018 °Elsevier Patient Education © 2020 [employer] ° °

## 2019-09-30 ENCOUNTER — Other Ambulatory Visit: Payer: Self-pay

## 2019-09-30 ENCOUNTER — Ambulatory Visit (INDEPENDENT_AMBULATORY_CARE_PROVIDER_SITE_OTHER): Payer: BC Managed Care – PPO | Admitting: Certified Nurse Midwife

## 2019-09-30 ENCOUNTER — Encounter: Payer: Self-pay | Admitting: Certified Nurse Midwife

## 2019-09-30 VITALS — BP 111/82 | HR 93 | Wt 160.5 lb

## 2019-09-30 DIAGNOSIS — Z3685 Encounter for antenatal screening for Streptococcus B: Secondary | ICD-10-CM

## 2019-09-30 DIAGNOSIS — Z3403 Encounter for supervision of normal first pregnancy, third trimester: Secondary | ICD-10-CM

## 2019-09-30 DIAGNOSIS — Z3A36 36 weeks gestation of pregnancy: Secondary | ICD-10-CM

## 2019-09-30 DIAGNOSIS — Z113 Encounter for screening for infections with a predominantly sexual mode of transmission: Secondary | ICD-10-CM

## 2019-09-30 LAB — POCT URINALYSIS DIPSTICK OB
Bilirubin, UA: NEGATIVE
Blood, UA: NEGATIVE
Glucose, UA: NEGATIVE
Leukocytes, UA: NEGATIVE
Nitrite, UA: NEGATIVE
POC,PROTEIN,UA: NEGATIVE
Spec Grav, UA: 1.01 (ref 1.010–1.025)
Urobilinogen, UA: 0.2 E.U./dL
pH, UA: 6 (ref 5.0–8.0)

## 2019-09-30 LAB — OB RESULTS CONSOLE GC/CHLAMYDIA: Gonorrhea: NEGATIVE

## 2019-09-30 NOTE — Patient Instructions (Signed)
Fetal Movement Counts Patient Name: ________________________________________________ Patient Due Date: ____________________ What is a fetal movement count?  A fetal movement count is the number of times that you feel your baby move during a certain amount of time. This may also be called a fetal kick count. A fetal movement count is recommended for every pregnant woman. You may be asked to start counting fetal movements as early as week 28 of your pregnancy. Pay attention to when your baby is most active. You may notice your baby's sleep and wake cycles. You may also notice things that make your baby move more. You should do a fetal movement count:  When your baby is normally most active.  At the same time each day. A good time to count movements is while you are resting, after having something to eat and drink. How do I count fetal movements? 1. Find a quiet, comfortable area. Sit, or lie down on your side. 2. Write down the date, the start time and stop time, and the number of movements that you felt between those two times. Take this information with you to your health care visits. 3. Write down your start time when you feel the first movement. 4. Count kicks, flutters, swishes, rolls, and jabs. You should feel at least 10 movements. 5. You may stop counting after you have felt 10 movements, or if you have been counting for 2 hours. Write down the stop time. 6. If you do not feel 10 movements in 2 hours, contact your health care provider for further instructions. Your health care provider may want to do additional tests to assess your baby's well-being. Contact a health care provider if:  You feel fewer than 10 movements in 2 hours.  Your baby is not moving like he or she usually does. Date: ____________ Start time: ____________ Stop time: ____________ Movements: ____________ Date: ____________ Start time: ____________ Stop time: ____________ Movements: ____________ Date: ____________  Start time: ____________ Stop time: ____________ Movements: ____________ Date: ____________ Start time: ____________ Stop time: ____________ Movements: ____________ Date: ____________ Start time: ____________ Stop time: ____________ Movements: ____________ Date: ____________ Start time: ____________ Stop time: ____________ Movements: ____________ Date: ____________ Start time: ____________ Stop time: ____________ Movements: ____________ Date: ____________ Start time: ____________ Stop time: ____________ Movements: ____________ Date: ____________ Start time: ____________ Stop time: ____________ Movements: ____________ This information is not intended to replace advice given to you by your health care provider. Make sure you discuss any questions you have with your health care provider. Document Revised: 11/22/2018 Document Reviewed: 11/22/2018 Elsevier Patient Education  2020 [employer]   Third Trimester of Pregnancy  The third trimester is from week 28 through week 40 (months 7 through 9). This trimester is when your unborn baby (fetus) is growing very fast. At the end of the ninth month, the unborn baby is about 20 inches in length. It weighs about 6-10 pounds. Follow these instructions at home: Medicines  Take over-the-counter and prescription medicines only as told by your doctor. Some medicines are safe and some medicines are not safe during pregnancy.  Take a prenatal vitamin that contains at least 600 micrograms (mcg) of folic acid.  If you have trouble pooping (constipation), take medicine that will make your stool soft (stool softener) if your doctor approves. Eating and drinking   Eat regular, healthy meals.  Avoid raw meat and uncooked cheese.  If you get low calcium from the food you eat, talk to your doctor about taking a daily calcium supplement.    Eat four or five small meals rather than three large meals a day.  Avoid foods that are high in fat and sugars, such as  fried and sweet foods.  To prevent constipation: ? Eat foods that are high in fiber, like fresh fruits and vegetables, whole grains, and beans. ? Drink enough fluids to keep your pee (urine) clear or pale yellow. Activity  Exercise only as told by your doctor. Stop exercising if you start to have cramps.  Avoid heavy lifting, wear low heels, and sit up straight.  Do not exercise if it is too hot, too humid, or if you are in a place of great height (high altitude).  You may continue to have sex unless your doctor tells you not to. Relieving pain and discomfort  Wear a good support bra if your breasts are tender.  Take frequent breaks and rest with your legs raised if you have leg cramps or low back pain.  Take warm water baths (sitz baths) to soothe pain or discomfort caused by hemorrhoids. Use hemorrhoid cream if your doctor approves.  If you develop puffy, bulging veins (varicose veins) in your legs: ? Wear support hose or compression stockings as told by your doctor. ? Raise (elevate) your feet for 15 minutes, 3-4 times a day. ? Limit salt in your food. Safety  Wear your seat belt when driving.  Make a list of emergency phone numbers, including numbers for family, friends, the hospital, and police and fire departments. Preparing for your baby's arrival To prepare for the arrival of your baby:  Take prenatal classes.  Practice driving to the hospital.  Visit the hospital and tour the maternity area.  Talk to your work about taking leave once the baby comes.  Pack your hospital bag.  Prepare the baby's room.  Go to your doctor visits.  Buy a rear-facing car seat. Learn how to install it in your car. General instructions  Do not use hot tubs, steam rooms, or saunas.  Do not use any products that contain nicotine or tobacco, such as cigarettes and e-cigarettes. If you need help quitting, ask your doctor.  Do not drink alcohol.  Do not douche or use tampons or  scented sanitary pads.  Do not cross your legs for long periods of time.  Do not travel for long distances unless you must. Only do so if your doctor says it is okay.  Visit your dentist if you have not gone during your pregnancy. Use a soft toothbrush to brush your teeth. Be gentle when you floss.  Avoid cat litter boxes and soil used by cats. These carry germs that can cause birth defects in the baby and can cause a loss of your baby (miscarriage) or stillbirth.  Keep all your prenatal visits as told by your doctor. This is important. Contact a doctor if:  You are not sure if you are in labor or if your water has broken.  You are dizzy.  You have mild cramps or pressure in your lower belly.  You have a nagging pain in your belly area.  You continue to feel sick to your stomach, you throw up, or you have watery poop.  You have bad smelling fluid coming from your vagina.  You have pain when you pee. Get help right away if:  You have a fever.  You are leaking fluid from your vagina.  You are spotting or bleeding from your vagina.  You have severe belly cramps or pain.  You   lose or gain weight quickly.  You have trouble catching your breath and have chest pain.  You notice sudden or extreme puffiness (swelling) of your face, hands, ankles, feet, or legs.  You have not felt the baby move in over an hour.  You have severe headaches that do not go away with medicine.  You have trouble seeing.  You are leaking, or you are having a gush of fluid, from your vagina before you are 37 weeks.  You have regular belly spasms (contractions) before you are 37 weeks. Summary  The third trimester is from week 28 through week 40 (months 7 through 9). This time is when your unborn baby is growing very fast.  Follow your doctor's advice about medicine, food, and activity.  Get ready for the arrival of your baby by taking prenatal classes, getting all the baby items ready,  preparing the baby's room, and visiting your doctor to be checked.  Get help right away if you are bleeding from your vagina, or you have chest pain and trouble catching your breath, or if you have not felt your baby move in over an hour. This information is not intended to replace advice given to you by your health care provider. Make sure you discuss any questions you have with your health care provider. Document Revised: 07/26/2018 Document Reviewed: 05/10/2016 Elsevier Patient Education  2020 [employer] Common Medications Safe in Pregnancy  Acne:      Constipation:  Benzoyl Peroxide     Colace  Clindamycin      Dulcolax Suppository  Topica Erythromycin     Fibercon  Salicylic Acid      Metamucil         Miralax AVOID:        Senakot   Accutane    Cough:  Retin-A       Cough Drops  Tetracycline      Phenergan w/ Codeine if Rx  Minocycline      Robitussin (Plain & DM)  Antibiotics:     Crabs/Lice:  Ceclor       RID  Cephalosporins    AVOID:  E-Mycins      Kwell  Keflex  Macrobid/Macrodantin   Diarrhea:  Penicillin      Kao-Pectate  Zithromax      Imodium AD         PUSH FLUIDS AVOID:       Cipro     Fever:  Tetracycline      Tylenol (Regular or Extra  Minocycline       Strength)  Levaquin      Extra Strength-Do not          Exceed 8 tabs/24 hrs Caffeine:        <200mg/day (equiv. To 1 cup of coffee or  approx. 3 12 oz sodas)         Gas: Cold/Hayfever:       Gas-X  Benadryl      Mylicon  Claritin       Phazyme  **Claritin-D        Chlor-Trimeton    Headaches:  Dimetapp      ASA-Free Excedrin  Drixoral-Non-Drowsy     Cold Compress  Mucinex (Guaifenasin)     Tylenol (Regular or Extra  Sudafed/Sudafed-12 Hour     Strength)  **Sudafed PE Pseudoephedrine   Tylenol Cold & Sinus     Vicks Vapor Rub  Zyrtec  **AVOID if Problems With Blood Pressure           Heartburn: Avoid lying down for at least 1 hour after meals  Aciphex      Maalox     Rash:  Milk of  Magnesia     Benadryl    Mylanta       1% Hydrocortisone Cream  Pepcid  Pepcid Complete   Sleep Aids:  Prevacid      Ambien   Prilosec       Benadryl  Rolaids       Chamomile Tea  Tums (Limit 4/day)     Unisom  Zantac       Tylenol PM         Warm milk-add vanilla or  Hemorrhoids:       Sugar for taste  Anusol/Anusol H.C.  (RX: Analapram 2.5%)  Sugar Substitutes:  Hydrocortisone OTC     Ok in moderation  Preparation H      Tucks        Vaseline lotion applied to tissue with wiping    Herpes:     Throat:  Acyclovir      Oragel  Famvir  Valtrex     Vaccines:         Flu Shot Leg Cramps:       *Gardasil  Benadryl      Hepatitis A         Hepatitis B Nasal Spray:       Pneumovax  Saline Nasal Spray     Polio Booster         Tetanus Nausea:       Tuberculosis test or PPD  Vitamin B6 25 mg TID   AVOID:    Dramamine      *Gardasil  Emetrol       Live Poliovirus  Ginger Root 250 mg QID    MMR (measles, mumps &  High Complex Carbs @ Bedtime    rebella)  Sea Bands-Accupressure    Varicella (Chickenpox)  Unisom 1/2 tab TID     *No known complications           If received before Pain:         Known pregnancy;   Darvocet       Resume series after  Lortab        Delivery  Percocet    Yeast:   Tramadol      Femstat  Tylenol 3      Gyne-lotrimin  Ultram       Monistat  Vicodin           MISC:         All Sunscreens           Hair Coloring/highlights          Insect Repellant's          (Including DEET)         Mystic Tans Breastfeeding  Choosing to breastfeed is one of the best decisions you can make for yourself and your baby. A change in hormones during pregnancy causes your breasts to make breast milk in your milk-producing glands. Hormones prevent breast milk from being released before your baby is born. They also prompt milk flow after birth. Once breastfeeding has begun, thoughts of your baby, as well as his or her sucking or crying, can stimulate the release of milk from  your milk-producing glands. Benefits of breastfeeding Research shows that breastfeeding offers many health benefits for infants and mothers. It also offers a cost-free and convenient way to feed your baby.  For your baby  Your first milk (colostrum) helps your baby's digestive system to function better.  Special cells in your milk (antibodies) help your baby to fight off infections.  Breastfed babies are less likely to develop asthma, allergies, obesity, or type 2 diabetes. They are also at lower risk for sudden infant death syndrome (SIDS).  Nutrients in breast milk are better able to meet your baby's needs compared to infant formula.  Breast milk improves your baby's brain development. For you  Breastfeeding helps to create a very special bond between you and your baby.  Breastfeeding is convenient. Breast milk costs nothing and is always available at the correct temperature.  Breastfeeding helps to burn calories. It helps you to lose the weight that you gained during pregnancy.  Breastfeeding makes your uterus return faster to its size before pregnancy. It also slows bleeding (lochia) after you give birth.  Breastfeeding helps to lower your risk of developing type 2 diabetes, osteoporosis, rheumatoid arthritis, cardiovascular disease, and breast, ovarian, uterine, and endometrial cancer later in life. Breastfeeding basics Starting breastfeeding  Find a comfortable place to sit or lie down, with your neck and back well-supported.  Place a pillow or a rolled-up blanket under your baby to bring him or her to the level of your breast (if you are seated). Nursing pillows are specially designed to help support your arms and your baby while you breastfeed.  Make sure that your baby's tummy (abdomen) is facing your abdomen.  Gently massage your breast. With your fingertips, massage from the outer edges of your breast inward toward the nipple. This encourages milk flow. If your milk flows  slowly, you may need to continue this action during the feeding.  Support your breast with 4 fingers underneath and your thumb above your nipple (make the letter "C" with your hand). Make sure your fingers are well away from your nipple and your baby's mouth.  Stroke your baby's lips gently with your finger or nipple.  When your baby's mouth is open wide enough, quickly bring your baby to your breast, placing your entire nipple and as much of the areola as possible into your baby's mouth. The areola is the colored area around your nipple. ? More areola should be visible above your baby's upper lip than below the lower lip. ? Your baby's lips should be opened and extended outward (flanged) to ensure an adequate, comfortable latch. ? Your baby's tongue should be between his or her lower gum and your breast.  Make sure that your baby's mouth is correctly positioned around your nipple (latched). Your baby's lips should create a seal on your breast and be turned out (everted).  It is common for your baby to suck about 2-3 minutes in order to start the flow of breast milk. Latching Teaching your baby how to latch onto your breast properly is very important. An improper latch can cause nipple pain, decreased milk supply, and poor weight gain in your baby. Also, if your baby is not latched onto your nipple properly, he or she may swallow some air during feeding. This can make your baby fussy. Burping your baby when you switch breasts during the feeding can help to get rid of the air. However, teaching your baby to latch on properly is still the best way to prevent fussiness from swallowing air while breastfeeding. Signs that your baby has successfully latched onto your nipple  Silent tugging or silent sucking, without causing you pain. Infant's lips should be extended  outward (flanged).  Swallowing heard between every 3-4 sucks once your milk has started to flow (after your let-down milk reflex  occurs).  Muscle movement above and in front of his or her ears while sucking. Signs that your baby has not successfully latched onto your nipple  Sucking sounds or smacking sounds from your baby while breastfeeding.  Nipple pain. If you think your baby has not latched on correctly, slip your finger into the corner of your baby's mouth to break the suction and place it between your baby's gums. Attempt to start breastfeeding again. Signs of successful breastfeeding Signs from your baby  Your baby will gradually decrease the number of sucks or will completely stop sucking.  Your baby will fall asleep.  Your baby's body will relax.  Your baby will retain a small amount of milk in his or her mouth.  Your baby will let go of your breast by himself or herself. Signs from you  Breasts that have increased in firmness, weight, and size 1-3 hours after feeding.  Breasts that are softer immediately after breastfeeding.  Increased milk volume, as well as a change in milk consistency and color by the fifth day of breastfeeding.  Nipples that are not sore, cracked, or bleeding. Signs that your baby is getting enough milk  Wetting at least 1-2 diapers during the first 24 hours after birth.  Wetting at least 5-6 diapers every 24 hours for the first week after birth. The urine should be clear or pale yellow by the age of 5 days.  Wetting 6-8 diapers every 24 hours as your baby continues to grow and develop.  At least 3 stools in a 24-hour period by the age of 5 days. The stool should be soft and yellow.  At least 3 stools in a 24-hour period by the age of 7 days. The stool should be seedy and yellow.  No loss of weight greater than 10% of birth weight during the first 3 days of life.  Average weight gain of 4-7 oz (113-198 g) per week after the age of 4 days.  Consistent daily weight gain by the age of 5 days, without weight loss after the age of 2 weeks. After a feeding, your baby may  spit up a small amount of milk. This is normal. Breastfeeding frequency and duration Frequent feeding will help you make more milk and can prevent sore nipples and extremely full breasts (breast engorgement). Breastfeed when you feel the need to reduce the fullness of your breasts or when your baby shows signs of hunger. This is called "breastfeeding on demand." Signs that your baby is hungry include:  Increased alertness, activity, or restlessness.  Movement of the head from side to side.  Opening of the mouth when the corner of the mouth or cheek is stroked (rooting).  Increased sucking sounds, smacking lips, cooing, sighing, or squeaking.  Hand-to-mouth movements and sucking on fingers or hands.  Fussing or crying. Avoid introducing a pacifier to your baby in the first 4-6 weeks after your baby is born. After this time, you may choose to use a pacifier. Research has shown that pacifier use during the first year of a baby's life decreases the risk of sudden infant death syndrome (SIDS). Allow your baby to feed on each breast as long as he or she wants. When your baby unlatches or falls asleep while feeding from the first breast, offer the second breast. Because newborns are often sleepy in the first few weeks of  life, you may need to awaken your baby to get him or her to feed. Breastfeeding times will vary from baby to baby. However, the following rules can serve as a guide to help you make sure that your baby is properly fed:  Newborns (babies 68 weeks of age or younger) may breastfeed every 1-3 hours.  Newborns should not go without breastfeeding for longer than 3 hours during the day or 5 hours during the night.  You should breastfeed your baby a minimum of 8 times in a 24-hour period. Breast milk pumping     Pumping and storing breast milk allows you to make sure that your baby is exclusively fed your breast milk, even at times when you are unable to breastfeed. This is especially  important if you go back to work while you are still breastfeeding, or if you are not able to be present during feedings. Your lactation consultant can help you find a method of pumping that works best for you and give you guidelines about how long it is safe to store breast milk. Caring for your breasts while you breastfeed Nipples can become dry, cracked, and sore while breastfeeding. The following recommendations can help keep your breasts moisturized and healthy:  Avoid using soap on your nipples.  Wear a supportive bra designed especially for nursing. Avoid wearing underwire-style bras or extremely tight bras (sports bras).  Air-dry your nipples for 3-4 minutes after each feeding.  Use only cotton bra pads to absorb leaked breast milk. Leaking of breast milk between feedings is normal.  Use lanolin on your nipples after breastfeeding. Lanolin helps to maintain your skin's normal moisture barrier. Pure lanolin is not harmful (not toxic) to your baby. You may also hand express a few drops of breast milk and gently massage that milk into your nipples and allow the milk to air-dry. In the first few weeks after giving birth, some women experience breast engorgement. Engorgement can make your breasts feel heavy, warm, and tender to the touch. Engorgement peaks within 3-5 days after you give birth. The following recommendations can help to ease engorgement:  Completely empty your breasts while breastfeeding or pumping. You may want to start by applying warm, moist heat (in the shower or with warm, water-soaked hand towels) just before feeding or pumping. This increases circulation and helps the milk flow. If your baby does not completely empty your breasts while breastfeeding, pump any extra milk after he or she is finished.  Apply ice packs to your breasts immediately after breastfeeding or pumping, unless this is too uncomfortable for you. To do this: ? Put ice in a plastic bag. ? Place a towel  between your skin and the bag. ? Leave the ice on for 20 minutes, 2-3 times a day.  Make sure that your baby is latched on and positioned properly while breastfeeding. If engorgement persists after 48 hours of following these recommendations, contact your health care provider or a Science writer. Overall health care recommendations while breastfeeding  Eat 3 healthy meals and 3 snacks every day. Well-nourished mothers who are breastfeeding need an additional 450-500 calories a day. You can meet this requirement by increasing the amount of a balanced diet that you eat.  Drink enough water to keep your urine pale yellow or clear.  Rest often, relax, and continue to take your prenatal vitamins to prevent fatigue, stress, and low vitamin and mineral levels in your body (nutrient deficiencies).  Do not use any products that contain nicotine  or tobacco, such as cigarettes and e-cigarettes. Your baby may be harmed by chemicals from cigarettes that pass into breast milk and exposure to secondhand smoke. If you need help quitting, ask your health care provider.  Avoid alcohol.  Do not use illegal drugs or marijuana.  Talk with your health care provider before taking any medicines. These include over-the-counter and prescription medicines as well as vitamins and herbal supplements. Some medicines that may be harmful to your baby can pass through breast milk.  It is possible to become pregnant while breastfeeding. If birth control is desired, ask your health care provider about options that will be safe while breastfeeding your baby. Where to find more information: [employer]: www.llli.org Contact a health care provider if:  You feel like you want to stop breastfeeding or have become frustrated with breastfeeding.  Your nipples are cracked or bleeding.  Your breasts are red, tender, or warm.  You have: ? Painful breasts or nipples. ? A swollen area on either  breast. ? A fever or chills. ? Nausea or vomiting. ? Drainage other than breast milk from your nipples.  Your breasts do not become full before feedings by the fifth day after you give birth.  You feel sad and depressed.  Your baby is: ? Too sleepy to eat well. ? Having trouble sleeping. ? More than 39 week old and wetting fewer than 6 diapers in a 24-hour period. ? Not gaining weight by 19 days of age.  Your baby has fewer than 3 stools in a 24-hour period.  Your baby's skin or the white parts of his or her eyes become yellow. Get help right away if:  Your baby is overly tired (lethargic) and does not want to wake up and feed.  Your baby develops an unexplained fever. Summary  Breastfeeding offers many health benefits for infant and mothers.  Try to breastfeed your infant when he or she shows early signs of hunger.  Gently tickle or stroke your baby's lips with your finger or nipple to allow the baby to open his or her mouth. Bring the baby to your breast. Make sure that much of the areola is in your baby's mouth. Offer one side and burp the baby before you offer the other side.  Talk with your health care provider or lactation consultant if you have questions or you face problems as you breastfeed. This information is not intended to replace advice given to you by your health care provider. Make sure you discuss any questions you have with your health care provider. Document Revised: 06/29/2017 Document Reviewed: 05/06/2016 Elsevier Patient Education  Index.

## 2019-09-30 NOTE — Progress Notes (Signed)
ROB- Doing well. No questions or concerns today. Collected thirty-six (36) week cultures, see orders. Pre-labor checklist, herbal prep guide and birth affirmations given. Anticipatory guidance given. SVE declined, vertex by [place]. Reviewed red flags and when to call the office.  RTC x 1 week for ROB or sooner if needed.  Glorious Peach RN [facility] [employer] 09/30/19 5:07 PM

## 2019-09-30 NOTE — Progress Notes (Signed)
ROB-Pt present for routine prenatal care and 36 weeks cultures.  Pt stated that she was doing well no problems.

## 2019-09-30 NOTE — Progress Notes (Signed)
I have seen, interviewed, and examined the patient in conjunction with the [employer] [facility] Nurse Practitioner student and affirm the diagnosis and management plan.  ° °Gethsemane Fischler Michelle Estiven Kohan, CNM °[facility] °

## 2019-10-02 LAB — STREP GP B NAA+RFLX: Strep Gp B NAA+Rflx: NEGATIVE

## 2019-10-02 LAB — GC/CHLAMYDIA PROBE AMP
Chlamydia trachomatis, NAA: NEGATIVE
Neisseria Gonorrhoeae by PCR: NEGATIVE

## 2019-10-08 ENCOUNTER — Ambulatory Visit (INDEPENDENT_AMBULATORY_CARE_PROVIDER_SITE_OTHER): Payer: BC Managed Care – PPO | Admitting: Certified Nurse Midwife

## 2019-10-08 ENCOUNTER — Other Ambulatory Visit: Payer: Self-pay

## 2019-10-08 VITALS — BP 114/76 | HR 85 | Wt 165.1 lb

## 2019-10-08 DIAGNOSIS — Z3A37 37 weeks gestation of pregnancy: Secondary | ICD-10-CM

## 2019-10-08 LAB — POCT URINALYSIS DIPSTICK OB
Bilirubin, UA: NEGATIVE
Blood, UA: NEGATIVE
Glucose, UA: NEGATIVE
Ketones, UA: NEGATIVE
Leukocytes, UA: NEGATIVE
Nitrite, UA: NEGATIVE
POC,PROTEIN,UA: NEGATIVE
Spec Grav, UA: 1.02 (ref 1.010–1.025)
Urobilinogen, UA: 0.2 E.U./dL
pH, UA: 5 (ref 5.0–8.0)

## 2019-10-08 NOTE — Progress Notes (Signed)
ROB doing well. Feels good movement and pelvic pressure. Discussed labor precautions. Reviewed GBS results. Reviewed remainder of PNC. Discussed SVE she declines today but thinks she will do next visit. Follow up 1 wk with Marcelino Duster.    Doreene Burke, CNM

## 2019-10-08 NOTE — Patient Instructions (Signed)
Braxton Hicks Contractions °Contractions of the uterus can occur throughout pregnancy, but they are not always a sign that you are in labor. You may have practice contractions called Braxton Hicks contractions. These false labor contractions are sometimes confused with true labor. °What are Braxton Hicks contractions? °Braxton Hicks contractions are tightening movements that occur in the muscles of the uterus before labor. Unlike true labor contractions, these contractions do not result in opening (dilation) and thinning of the cervix. Toward the end of pregnancy (32-34 weeks), Braxton Hicks contractions can happen more often and may become stronger. These contractions are sometimes difficult to tell apart from true labor because they can be very uncomfortable. You should not feel embarrassed if you go to the hospital with false labor. °Sometimes, the only way to tell if you are in true labor is for your health care provider to look for changes in the cervix. The health care provider will do a physical exam and may monitor your contractions. If you are not in true labor, the exam should show that your cervix is not dilating and your water has not broken. °If there are no other health problems associated with your pregnancy, it is completely safe for you to be sent home with false labor. You may continue to have Braxton Hicks contractions until you go into true labor. °How to tell the difference between true labor and false labor °True labor °· Contractions last 30-70 seconds. °· Contractions become very regular. °· Discomfort is usually felt in the top of the uterus, and it spreads to the lower abdomen and low back. °· Contractions do not go away with walking. °· Contractions usually become more intense and increase in frequency. °· The cervix dilates and gets thinner. °False labor °· Contractions are usually shorter and not as strong as true labor contractions. °· Contractions are usually irregular. °· Contractions  are often felt in the front of the lower abdomen and in the groin. °· Contractions may go away when you walk around or change positions while lying down. °· Contractions get weaker and are shorter-lasting as time goes on. °· The cervix usually does not dilate or become thin. °Follow these instructions at home: ° °· Take over-the-counter and prescription medicines only as told by your health care provider. °· Keep up with your usual exercises and follow other instructions from your health care provider. °· Eat and drink lightly if you think you are going into labor. °· If Braxton Hicks contractions are making you uncomfortable: °? Change your position from lying down or resting to walking, or change from walking to resting. °? Sit and rest in a tub of warm water. °? Drink enough fluid to keep your urine pale yellow. Dehydration may cause these contractions. °? Do slow and deep breathing several times an hour. °· Keep all follow-up prenatal visits as told by your health care provider. This is important. °Contact a health care provider if: °· You have a fever. °· You have continuous pain in your abdomen. °Get help right away if: °· Your contractions become stronger, more regular, and closer together. °· You have fluid leaking or gushing from your vagina. °· You pass blood-tinged mucus (bloody show). °· You have bleeding from your vagina. °· You have low back pain that you never had before. °· You feel your baby’s head pushing down and causing pelvic pressure. °· Your baby is not moving inside you as much as it used to. °Summary °· Contractions that occur before labor are   called Braxton Hicks contractions, false labor, or practice contractions. °· Braxton Hicks contractions are usually shorter, weaker, farther apart, and less regular than true labor contractions. True labor contractions usually become progressively stronger and regular, and they become more frequent. °· Manage discomfort from Braxton Hicks contractions  by changing position, resting in a warm bath, drinking plenty of water, or practicing deep breathing. °This information is not intended to replace advice given to you by your health care provider. Make sure you discuss any questions you have with your health care provider. °Document Revised: 03/17/2017 Document Reviewed: 08/18/2016 °Elsevier Patient Education © 2020 Elsevier Inc. ° °

## 2019-10-14 ENCOUNTER — Other Ambulatory Visit: Payer: Self-pay

## 2019-10-14 ENCOUNTER — Encounter: Payer: Self-pay | Admitting: Certified Nurse Midwife

## 2019-10-14 ENCOUNTER — Ambulatory Visit (INDEPENDENT_AMBULATORY_CARE_PROVIDER_SITE_OTHER): Payer: BC Managed Care – PPO | Admitting: Certified Nurse Midwife

## 2019-10-14 VITALS — BP 119/77 | HR 83 | Wt 165.5 lb

## 2019-10-14 DIAGNOSIS — Z3A38 38 weeks gestation of pregnancy: Secondary | ICD-10-CM

## 2019-10-14 DIAGNOSIS — Z3403 Encounter for supervision of normal first pregnancy, third trimester: Secondary | ICD-10-CM

## 2019-10-14 LAB — POCT URINALYSIS DIPSTICK OB
Bilirubin, UA: NEGATIVE
Blood, UA: NEGATIVE
Glucose, UA: NEGATIVE
Ketones, UA: NEGATIVE
Leukocytes, UA: NEGATIVE
Nitrite, UA: NEGATIVE
POC,PROTEIN,UA: NEGATIVE
Spec Grav, UA: 1.01 (ref 1.010–1.025)
Urobilinogen, UA: 0.2 E.U./dL
pH, UA: 6.5 (ref 5.0–8.0)

## 2019-10-14 NOTE — Patient Instructions (Signed)
Vaginal Delivery  Vaginal delivery means that you give birth by pushing your baby out of your birth canal (vagina). A team of health care providers will help you before, during, and after vaginal delivery. Birth experiences are unique for every woman and every pregnancy, and birth experiences vary depending on where you choose to give birth. What happens when I arrive at the birth center or hospital? Once you are in labor and have been admitted into the hospital or birth center, your health care provider may:  Review your pregnancy history and any concerns that you have.  Insert an IV into one of your veins. This may be used to give you fluids and medicines.  Check your blood pressure, pulse, temperature, and heart rate (vital signs).  Check whether your bag of water (amniotic sac) has broken (ruptured).  Talk with you about your birth plan and discuss pain control options. Monitoring Your health care provider may monitor your contractions (uterine monitoring) and your baby's heart rate (fetal monitoring). You may need to be monitored:  Often, but not continuously (intermittently).  All the time or for long periods at a time (continuously). Continuous monitoring may be needed if: ? You are taking certain medicines, such as medicine to relieve pain or make your contractions stronger. ? You have pregnancy or labor complications. Monitoring may be done by:  Placing a special stethoscope or a handheld monitoring device on your abdomen to check your baby's heartbeat and to check for contractions.  Placing monitors on your abdomen (external monitors) to record your baby's heartbeat and the frequency and length of contractions.  Placing monitors inside your uterus through your vagina (internal monitors) to record your baby's heartbeat and the frequency, length, and strength of your contractions. Depending on the type of monitor, it may remain in your uterus or on your baby's head until  birth.  Telemetry. This is a type of continuous monitoring that can be done with external or internal monitors. Instead of having to stay in bed, you are able to move around during telemetry. Physical exam Your health care provider may perform frequent physical exams. This may include:  Checking how and where your baby is positioned in your uterus.  Checking your cervix to determine: ? Whether it is thinning out (effacing). ? Whether it is opening up (dilating). What happens during labor and delivery?  Normal labor and delivery is divided into the following three stages: Stage 1  This is the longest stage of labor.  This stage can last for hours or days.  Throughout this stage, you will feel contractions. Contractions generally feel mild, infrequent, and irregular at first. They get stronger, more frequent (about every 2-3 minutes), and more regular as you move through this stage.  This stage ends when your cervix is completely dilated to 4 inches (10 cm) and completely effaced. Stage 2  This stage starts once your cervix is completely effaced and dilated and lasts until the delivery of your baby.  This stage may last from 20 minutes to 2 hours.  This is the stage where you will feel an urge to push your baby out of your vagina.  You may feel stretching and burning pain, especially when the widest part of your baby's head passes through the vaginal opening (crowning).  Once your baby is delivered, the umbilical cord will be clamped and cut. This usually occurs after waiting a period of 1-2 minutes after delivery.  Your baby will be placed on your bare chest (  skin-to-skin contact) in an upright position and covered with a warm blanket. Watch your baby for feeding cues, like rooting or sucking, and help the baby to your breast for his or her first feeding. Stage 3  This stage starts immediately after the birth of your baby and ends after you deliver the placenta.  This stage may  take anywhere from 5 to 30 minutes.  After your baby has been delivered, you will feel contractions as your body expels the placenta and your uterus contracts to control bleeding. What can I expect after labor and delivery?  After labor is over, you and your baby will be monitored closely until you are ready to go home to ensure that you are both healthy. Your health care team will teach you how to care for yourself and your baby.  You and your baby will stay in the same room (rooming in) during your hospital stay. This will encourage early bonding and successful breastfeeding.  You may continue to receive fluids and medicines through an IV.  Your uterus will be checked and massaged regularly (fundal massage).  You will have some soreness and pain in your abdomen, vagina, and the area of skin between your vaginal opening and your anus (perineum).  If an incision was made near your vagina (episiotomy) or if you had some vaginal tearing during delivery, cold compresses may be placed on your episiotomy or your tear. This helps to reduce pain and swelling.  You may be given a squirt bottle to use instead of wiping when you go to the bathroom. To use the squirt bottle, follow these steps: ? Before you urinate, fill the squirt bottle with warm water. Do not use hot water. ? After you urinate, while you are sitting on the toilet, use the squirt bottle to rinse the area around your urethra and vaginal opening. This rinses away any urine and blood. ? Fill the squirt bottle with clean water every time you use the bathroom.  It is normal to have vaginal bleeding after delivery. Wear a sanitary pad for vaginal bleeding and discharge. Summary  Vaginal delivery means that you will give birth by pushing your baby out of your birth canal (vagina).  Your health care provider may monitor your contractions (uterine monitoring) and your baby's heart rate (fetal monitoring).  Your health care provider may  perform a physical exam.  Normal labor and delivery is divided into three stages.  After labor is over, you and your baby will be monitored closely until you are ready to go home. This information is not intended to replace advice given to you by your health care provider. Make sure you discuss any questions you have with your health care provider. Document Revised: 05/09/2017 Document Reviewed: 05/09/2017 Elsevier Patient Education  2020 Elsevier Inc.    Fetal Movement Counts Patient Name: ________________________________________________ Patient Due Date: ____________________ What is a fetal movement count?  A fetal movement count is the number of times that you feel your baby move during a certain amount of time. This may also be called a fetal kick count. A fetal movement count is recommended for every pregnant woman. You may be asked to start counting fetal movements as early as week 28 of your pregnancy. Pay attention to when your baby is most active. You may notice your baby's sleep and wake cycles. You may also notice things that make your baby move more. You should do a fetal movement count:  When your baby is normally most   active.  At the same time each day. A good time to count movements is while you are resting, after having something to eat and drink. How do I count fetal movements? 1. Find a quiet, comfortable area. Sit, or lie down on your side. 2. Write down the date, the start time and stop time, and the number of movements that you felt between those two times. Take this information with you to your health care visits. 3. Write down your start time when you feel the first movement. 4. Count kicks, flutters, swishes, rolls, and jabs. You should feel at least 10 movements. 5. You may stop counting after you have felt 10 movements, or if you have been counting for 2 hours. Write down the stop time. 6. If you do not feel 10 movements in 2 hours, contact your health care  provider for further instructions. Your health care provider may want to do additional tests to assess your baby's well-being. Contact a health care provider if:  You feel fewer than 10 movements in 2 hours.  Your baby is not moving like he or she usually does. Date: ____________ Start time: ____________ Stop time: ____________ Movements: ____________ Date: ____________ Start time: ____________ Stop time: ____________ Movements: ____________ Date: ____________ Start time: ____________ Stop time: ____________ Movements: ____________ Date: ____________ Start time: ____________ Stop time: ____________ Movements: ____________ Date: ____________ Start time: ____________ Stop time: ____________ Movements: ____________ Date: ____________ Start time: ____________ Stop time: ____________ Movements: ____________ Date: ____________ Start time: ____________ Stop time: ____________ Movements: ____________ Date: ____________ Start time: ____________ Stop time: ____________ Movements: ____________ Date: ____________ Start time: ____________ Stop time: ____________ Movements: ____________ This information is not intended to replace advice given to you by your health care provider. Make sure you discuss any questions you have with your health care provider. Document Revised: 11/22/2018 Document Reviewed: 11/22/2018 Elsevier Patient Education  2020 Elsevier Inc.  

## 2019-10-19 NOTE — Progress Notes (Signed)
ROB-Doing well, reports increased pelvic pressure. Discussed home treatment measures. Started herbal prep. Request SVE. Copy of [employer] given. Anticipatory guidance regarding course of prenatal care. Reviewed red flag symptoms and when to call. RTC x 1 week for ROB or sooner if needed.

## 2019-10-22 ENCOUNTER — Other Ambulatory Visit: Payer: Self-pay

## 2019-10-22 ENCOUNTER — Ambulatory Visit (INDEPENDENT_AMBULATORY_CARE_PROVIDER_SITE_OTHER): Payer: BC Managed Care – PPO | Admitting: Certified Nurse Midwife

## 2019-10-22 VITALS — BP 122/77 | HR 88 | Wt 167.3 lb

## 2019-10-22 DIAGNOSIS — Z3A39 39 weeks gestation of pregnancy: Secondary | ICD-10-CM

## 2019-10-22 NOTE — Progress Notes (Signed)
ROB doing well. Feels good movement. Discussed labor precautions. Follow up 1 wk for u/s( growth/afi) and ROB with Marcelino Duster or Pattricia Boss for first available .   Doreene Burke, CNM

## 2019-10-22 NOTE — Patient Instructions (Signed)
Braxton Hicks Contractions °Contractions of the uterus can occur throughout pregnancy, but they are not always a sign that you are in labor. You may have practice contractions called Braxton Hicks contractions. These false labor contractions are sometimes confused with true labor. °What are Braxton Hicks contractions? °Braxton Hicks contractions are tightening movements that occur in the muscles of the uterus before labor. Unlike true labor contractions, these contractions do not result in opening (dilation) and thinning of the cervix. Toward the end of pregnancy (32-34 weeks), Braxton Hicks contractions can happen more often and may become stronger. These contractions are sometimes difficult to tell apart from true labor because they can be very uncomfortable. You should not feel embarrassed if you go to the hospital with false labor. °Sometimes, the only way to tell if you are in true labor is for your health care provider to look for changes in the cervix. The health care provider will do a physical exam and may monitor your contractions. If you are not in true labor, the exam should show that your cervix is not dilating and your water has not broken. °If there are no other health problems associated with your pregnancy, it is completely safe for you to be sent home with false labor. You may continue to have Braxton Hicks contractions until you go into true labor. °How to tell the difference between true labor and false labor °True labor °· Contractions last 30-70 seconds. °· Contractions become very regular. °· Discomfort is usually felt in the top of the uterus, and it spreads to the lower abdomen and low back. °· Contractions do not go away with walking. °· Contractions usually become more intense and increase in frequency. °· The cervix dilates and gets thinner. °False labor °· Contractions are usually shorter and not as strong as true labor contractions. °· Contractions are usually irregular. °· Contractions  are often felt in the front of the lower abdomen and in the groin. °· Contractions may go away when you walk around or change positions while lying down. °· Contractions get weaker and are shorter-lasting as time goes on. °· The cervix usually does not dilate or become thin. °Follow these instructions at home: ° °· Take over-the-counter and prescription medicines only as told by your health care provider. °· Keep up with your usual exercises and follow other instructions from your health care provider. °· Eat and drink lightly if you think you are going into labor. °· If Braxton Hicks contractions are making you uncomfortable: °? Change your position from lying down or resting to walking, or change from walking to resting. °? Sit and rest in a tub of warm water. °? Drink enough fluid to keep your urine pale yellow. Dehydration may cause these contractions. °? Do slow and deep breathing several times an hour. °· Keep all follow-up prenatal visits as told by your health care provider. This is important. °Contact a health care provider if: °· You have a fever. °· You have continuous pain in your abdomen. °Get help right away if: °· Your contractions become stronger, more regular, and closer together. °· You have fluid leaking or gushing from your vagina. °· You pass blood-tinged mucus (bloody show). °· You have bleeding from your vagina. °· You have low back pain that you never had before. °· You feel your baby’s head pushing down and causing pelvic pressure. °· Your baby is not moving inside you as much as it used to. °Summary °· Contractions that occur before labor are   called Braxton Hicks contractions, false labor, or practice contractions. °· Braxton Hicks contractions are usually shorter, weaker, farther apart, and less regular than true labor contractions. True labor contractions usually become progressively stronger and regular, and they become more frequent. °· Manage discomfort from Braxton Hicks contractions  by changing position, resting in a warm bath, drinking plenty of water, or practicing deep breathing. °This information is not intended to replace advice given to you by your health care provider. Make sure you discuss any questions you have with your health care provider. °Document Revised: 03/17/2017 Document Reviewed: 08/18/2016 °Elsevier Patient Education © 2020 Elsevier Inc. ° °

## 2019-10-29 ENCOUNTER — Encounter: Payer: Self-pay | Admitting: Certified Nurse Midwife

## 2019-10-29 ENCOUNTER — Ambulatory Visit (INDEPENDENT_AMBULATORY_CARE_PROVIDER_SITE_OTHER): Payer: BC Managed Care – PPO

## 2019-10-29 ENCOUNTER — Other Ambulatory Visit: Payer: Self-pay

## 2019-10-29 ENCOUNTER — Ambulatory Visit (INDEPENDENT_AMBULATORY_CARE_PROVIDER_SITE_OTHER): Payer: BC Managed Care – PPO | Admitting: Certified Nurse Midwife

## 2019-10-29 VITALS — BP 124/75 | HR 99 | Temp 98.5°F | Wt 169.0 lb

## 2019-10-29 DIAGNOSIS — Z3A39 39 weeks gestation of pregnancy: Secondary | ICD-10-CM | POA: Diagnosis not present

## 2019-10-29 DIAGNOSIS — O368390 Maternal care for abnormalities of the fetal heart rate or rhythm, unspecified trimester, not applicable or unspecified: Secondary | ICD-10-CM | POA: Diagnosis not present

## 2019-10-29 DIAGNOSIS — Z3A40 40 weeks gestation of pregnancy: Secondary | ICD-10-CM | POA: Diagnosis not present

## 2019-10-29 LAB — POCT URINALYSIS DIPSTICK OB
Bilirubin, UA: NEGATIVE
Glucose, UA: NEGATIVE
Ketones, UA: NEGATIVE
Leukocytes, UA: NEGATIVE
Nitrite, UA: NEGATIVE
POC,PROTEIN,UA: NEGATIVE
Spec Grav, UA: 1.01 (ref 1.010–1.025)
Urobilinogen, UA: 0.2 E.U./dL
pH, UA: 5 (ref 5.0–8.0)

## 2019-10-29 NOTE — Progress Notes (Signed)
ROB , doing well, Feels good movement. U/s today for growth/AFI (see below) due to post dates, FHR on u/s elevated 195. NST completed. SVE:  Discussed induction NST INTERPRETATION:Reactive , category 1  Indications: tachycardia on u/s Baseline 150 Moderate variability No decelerations Accelerations present  Occasional contractions   Impression: reactive   Plan:  Induction for post dates on Thursday    Doreene Burke, CNM  Patient Name: Karen Zuniga DOB: 1991-04-08 MRN: [id number] ULTRASOUND REPORT  Location: [facility] Date of Service: 10/29/2019   Indications:growth/afi Findings:  Mason Jim intrauterine pregnancy is visualized with FHR at 195 BPM. Biometrics give an (U/S) Gestational age of [redacted]w[redacted]d and an (U/S) EDD of 10/27/2019; this correlates with the clinically established Estimated Date of Delivery: 10/26/19.  Fetal presentation is Cephalic.  Placenta: anterior. Grade: 1 AFI: 18.6 cm  Growth percentile is 71. EFW: 3843 ( 8 lbs 8 oz)   Impression: 1. [redacted]w[redacted]d Viable Singleton Intrauterine pregnancy previously established criteria. 2. Growth is 71 %ile.  AFI is 18.6 cm.   Recommendations: 1.Clinical correlation with the patient's History and Physical Exam.   Jenine  M. Marciano Sequin     RDMS

## 2019-10-31 ENCOUNTER — Encounter
Admission: AD | Disposition: A | Discharge status: Home / Self Care | Payer: Self-pay | Source: Ambulatory Visit | Attending: Certified Nurse Midwife

## 2019-10-31 ENCOUNTER — Inpatient Hospital Stay
Admission: AD | Admit: 2019-10-31 | Discharge: 2019-11-02 | DRG: 787 | Disposition: A | Discharge status: Home / Self Care | Payer: BC Managed Care – PPO | Source: Ambulatory Visit | Attending: Certified Nurse Midwife | Admitting: Certified Nurse Midwife

## 2019-10-31 ENCOUNTER — Inpatient Hospital Stay: Payer: BC Managed Care – PPO | Admitting: Anesthesiology

## 2019-10-31 ENCOUNTER — Encounter: Payer: Self-pay | Admitting: Certified Nurse Midwife

## 2019-10-31 ENCOUNTER — Other Ambulatory Visit: Payer: Self-pay

## 2019-10-31 DIAGNOSIS — O322XX0 Maternal care for transverse and oblique lie, not applicable or unspecified: Secondary | ICD-10-CM | POA: Diagnosis present

## 2019-10-31 DIAGNOSIS — Z20822 Contact with and (suspected) exposure to covid-19: Secondary | ICD-10-CM | POA: Diagnosis present

## 2019-10-31 DIAGNOSIS — O480 Post-term pregnancy: Secondary | ICD-10-CM | POA: Diagnosis present

## 2019-10-31 DIAGNOSIS — Z880 Allergy status to penicillin: Secondary | ICD-10-CM

## 2019-10-31 DIAGNOSIS — O9081 Anemia of the puerperium: Secondary | ICD-10-CM | POA: Diagnosis not present

## 2019-10-31 DIAGNOSIS — D62 Acute posthemorrhagic anemia: Secondary | ICD-10-CM | POA: Diagnosis not present

## 2019-10-31 DIAGNOSIS — Z3A40 40 weeks gestation of pregnancy: Secondary | ICD-10-CM

## 2019-10-31 DIAGNOSIS — Z2839 Other underimmunization status: Secondary | ICD-10-CM

## 2019-10-31 DIAGNOSIS — Z6730 Type AB blood, Rh positive: Secondary | ICD-10-CM

## 2019-10-31 LAB — TYPE AND SCREEN
ABO/RH(D): AB POS
Antibody Screen: NEGATIVE

## 2019-10-31 LAB — CBC
HCT: 36.4 % (ref 36.0–46.0)
Hemoglobin: 13 g/dL (ref 12.0–15.0)
MCH: 33.6 pg (ref 26.0–34.0)
MCHC: 35.7 g/dL (ref 30.0–36.0)
MCV: 94.1 fL (ref 80.0–100.0)
Platelets: 181 10*3/uL (ref 150–400)
RBC: 3.87 MIL/uL (ref 3.87–5.11)
RDW: 12.4 % (ref 11.5–15.5)
WBC: 9 10*3/uL (ref 4.0–10.5)
nRBC: 0 % (ref 0.0–0.2)

## 2019-10-31 LAB — SARS CORONAVIRUS 2 BY RT PCR (HOSPITAL ORDER, PERFORMED IN ~~LOC~~ HOSPITAL LAB): SARS Coronavirus 2: NEGATIVE

## 2019-10-31 LAB — ABO/RH: ABO/RH(D): AB POS

## 2019-10-31 SURGERY — Surgical Case
Anesthesia: Spinal

## 2019-10-31 MED ORDER — ACETAMINOPHEN 325 MG PO TABS
650.0000 mg | ORAL_TABLET | Freq: Four times a day (QID) | ORAL | Status: AC
  Administered 2019-11-01 (×2): 650 mg via ORAL
  Filled 2019-10-31 (×2): qty 2

## 2019-10-31 MED ORDER — ZOLPIDEM TARTRATE 5 MG PO TABS: 5.0000 mg | ORAL_TABLET | Freq: Every evening | ORAL | Status: DC | PRN

## 2019-10-31 MED ORDER — DIPHENHYDRAMINE HCL 50 MG/ML IJ SOLN: 12.5000 mg | INTRAMUSCULAR | Status: DC | PRN

## 2019-10-31 MED ORDER — OXYTOCIN BOLUS FROM INFUSION: 333.0000 mL | Freq: Once | INTRAVENOUS | Status: DC

## 2019-10-31 MED ORDER — WITCH HAZEL-GLYCERIN EX PADS
1.0000 "application " | MEDICATED_PAD | CUTANEOUS | Status: DC | PRN
  Administered 2019-11-01: 1 via TOPICAL
  Filled 2019-10-31: qty 100

## 2019-10-31 MED ORDER — MISOPROSTOL 200 MCG PO TABS
ORAL_TABLET | ORAL | Status: AC
  Filled 2019-10-31: qty 4

## 2019-10-31 MED ORDER — DIPHENHYDRAMINE HCL 25 MG PO CAPS: 25.0000 mg | ORAL_CAPSULE | Freq: Four times a day (QID) | ORAL | Status: DC | PRN

## 2019-10-31 MED ORDER — KETOROLAC TROMETHAMINE 30 MG/ML IJ SOLN
30.0000 mg | Freq: Four times a day (QID) | INTRAMUSCULAR | Status: DC
  Administered 2019-10-31: 30 mg via INTRAVENOUS
  Filled 2019-10-31: qty 1

## 2019-10-31 MED ORDER — OXYTOCIN-SODIUM CHLORIDE 30-0.9 UT/500ML-% IV SOLN
INTRAVENOUS | Status: DC | PRN
  Administered 2019-10-31: 700 mL via INTRAVENOUS

## 2019-10-31 MED ORDER — BUPIVACAINE HCL 0.5 % IJ SOLN
INTRAMUSCULAR | Status: DC | PRN
  Administered 2019-10-31: 30 mL

## 2019-10-31 MED ORDER — GABAPENTIN 300 MG PO CAPS
300.0000 mg | ORAL_CAPSULE | Freq: Two times a day (BID) | ORAL | Status: DC
  Administered 2019-10-31 – 2019-11-02 (×4): 300 mg via ORAL
  Filled 2019-10-31 (×4): qty 1

## 2019-10-31 MED ORDER — SOD CITRATE-CITRIC ACID 500-334 MG/5ML PO SOLN
30.0000 mL | ORAL | Status: AC
  Administered 2019-10-31: 30 mL via ORAL
  Filled 2019-10-31: qty 30

## 2019-10-31 MED ORDER — ONDANSETRON 4 MG PO TBDP: 4.0000 mg | ORAL_TABLET | Freq: Three times a day (TID) | ORAL | Status: DC | PRN

## 2019-10-31 MED ORDER — OXYCODONE-ACETAMINOPHEN 5-325 MG PO TABS: 1.0000 | ORAL_TABLET | Freq: Four times a day (QID) | ORAL | Status: DC | PRN

## 2019-10-31 MED ORDER — DEXAMETHASONE SODIUM PHOSPHATE 10 MG/ML IJ SOLN
INTRAMUSCULAR | Status: AC
  Filled 2019-10-31: qty 1

## 2019-10-31 MED ORDER — SOD CITRATE-CITRIC ACID 500-334 MG/5ML PO SOLN: 30.0000 mL | ORAL | Status: DC | PRN

## 2019-10-31 MED ORDER — BUTORPHANOL TARTRATE 1 MG/ML IJ SOLN: 1.0000 mg | INTRAMUSCULAR | Status: DC | PRN

## 2019-10-31 MED ORDER — MAGNESIUM HYDROXIDE 400 MG/5ML PO SUSP: 30.0000 mL | ORAL | Status: DC | PRN

## 2019-10-31 MED ORDER — PROPOFOL 10 MG/ML IV BOLUS
INTRAVENOUS | Status: AC
  Filled 2019-10-31: qty 20

## 2019-10-31 MED ORDER — MEASLES, MUMPS & RUBELLA VAC IJ SOLR
0.5000 mL | Freq: Once | INTRAMUSCULAR | Status: DC
  Filled 2019-10-31: qty 0.5

## 2019-10-31 MED ORDER — CEFAZOLIN SODIUM-DEXTROSE 2-4 GM/100ML-% IV SOLN
2.0000 g | INTRAVENOUS | Status: AC
  Administered 2019-10-31: 2 g via INTRAVENOUS
  Filled 2019-10-31 (×2): qty 100

## 2019-10-31 MED ORDER — MENTHOL 3 MG MT LOZG
1.0000 | LOZENGE | OROMUCOSAL | Status: DC | PRN
  Filled 2019-10-31: qty 9

## 2019-10-31 MED ORDER — KETOROLAC TROMETHAMINE 30 MG/ML IJ SOLN: 30.0000 mg | Freq: Four times a day (QID) | INTRAMUSCULAR | Status: DC

## 2019-10-31 MED ORDER — COCONUT OIL OIL
1.0000 "application " | TOPICAL_OIL | Status: DC | PRN
  Administered 2019-10-31: 1 via TOPICAL
  Filled 2019-10-31: qty 120

## 2019-10-31 MED ORDER — BUPIVACAINE IN DEXTROSE 0.75-8.25 % IT SOLN
INTRATHECAL | Status: DC | PRN
  Administered 2019-10-31: 1.6 mL via INTRATHECAL

## 2019-10-31 MED ORDER — ONDANSETRON HCL 4 MG/2ML IJ SOLN
4.0000 mg | Freq: Three times a day (TID) | INTRAMUSCULAR | Status: DC | PRN
  Administered 2019-10-31: 4 mg via INTRAVENOUS
  Filled 2019-10-31: qty 2

## 2019-10-31 MED ORDER — LACTATED RINGERS IV SOLN: 500.0000 mL | INTRAVENOUS | Status: DC | PRN

## 2019-10-31 MED ORDER — NALBUPHINE HCL 10 MG/ML IJ SOLN: 5.0000 mg | INTRAMUSCULAR | Status: DC | PRN

## 2019-10-31 MED ORDER — MORPHINE SULFATE (PF) 0.5 MG/ML IJ SOLN
INTRAMUSCULAR | Status: AC
  Filled 2019-10-31: qty 10

## 2019-10-31 MED ORDER — KETOROLAC TROMETHAMINE 30 MG/ML IJ SOLN: 30.0000 mg | Freq: Once | INTRAMUSCULAR | Status: AC

## 2019-10-31 MED ORDER — ONDANSETRON HCL 4 MG/2ML IJ SOLN
INTRAMUSCULAR | Status: AC
  Filled 2019-10-31: qty 2

## 2019-10-31 MED ORDER — DIPHENHYDRAMINE HCL 25 MG PO CAPS: 25.0000 mg | ORAL_CAPSULE | ORAL | Status: DC | PRN

## 2019-10-31 MED ORDER — IBUPROFEN 800 MG PO TABS: 800.0000 mg | ORAL_TABLET | Freq: Four times a day (QID) | ORAL | Status: DC

## 2019-10-31 MED ORDER — ACETAMINOPHEN 325 MG PO TABS: 650.0000 mg | ORAL_TABLET | ORAL | Status: DC | PRN

## 2019-10-31 MED ORDER — BUPIVACAINE LIPOSOME 1.3 % IJ SUSP
20.0000 mL | Freq: Once | INTRAMUSCULAR | Status: DC
  Filled 2019-10-31: qty 20

## 2019-10-31 MED ORDER — SODIUM CHLORIDE (PF) 0.9 % IJ SOLN
INTRAMUSCULAR | Status: AC
  Filled 2019-10-31: qty 50

## 2019-10-31 MED ORDER — SODIUM CHLORIDE 0.9 % IV SOLN
INTRAVENOUS | Status: DC | PRN
  Administered 2019-10-31: 30 ug/min via INTRAVENOUS

## 2019-10-31 MED ORDER — NALBUPHINE HCL 10 MG/ML IJ SOLN: 5.0000 mg | Freq: Once | INTRAMUSCULAR | Status: DC | PRN

## 2019-10-31 MED ORDER — FERROUS SULFATE 325 (65 FE) MG PO TABS
325.0000 mg | ORAL_TABLET | Freq: Two times a day (BID) | ORAL | Status: DC
  Administered 2019-11-01 – 2019-11-02 (×3): 325 mg via ORAL
  Filled 2019-10-31 (×3): qty 1

## 2019-10-31 MED ORDER — MEPERIDINE HCL 25 MG/ML IJ SOLN: 6.2500 mg | INTRAMUSCULAR | Status: DC | PRN

## 2019-10-31 MED ORDER — PHENYLEPHRINE HCL (PRESSORS) 10 MG/ML IV SOLN
INTRAVENOUS | Status: AC
  Filled 2019-10-31: qty 1

## 2019-10-31 MED ORDER — OXYCODONE HCL 5 MG PO TABS: 5.0000 mg | ORAL_TABLET | Freq: Once | ORAL | Status: DC | PRN

## 2019-10-31 MED ORDER — BUPIVACAINE LIPOSOME 1.3 % IJ SUSP
INTRAMUSCULAR | Status: DC | PRN
  Administered 2019-10-31: 20 mL

## 2019-10-31 MED ORDER — TRAMADOL HCL 50 MG PO TABS
50.0000 mg | ORAL_TABLET | Freq: Four times a day (QID) | ORAL | Status: DC | PRN
  Administered 2019-11-01: 50 mg via ORAL
  Filled 2019-10-31: qty 1

## 2019-10-31 MED ORDER — LIDOCAINE HCL (PF) 1 % IJ SOLN
30.0000 mL | INTRAMUSCULAR | Status: DC | PRN
  Filled 2019-10-31: qty 30

## 2019-10-31 MED ORDER — NALOXONE HCL 4 MG/10ML IJ SOLN
1.0000 ug/kg/h | INTRAVENOUS | Status: DC | PRN
  Filled 2019-10-31: qty 5

## 2019-10-31 MED ORDER — MISOPROSTOL 50MCG HALF TABLET
50.0000 ug | ORAL_TABLET | ORAL | Status: DC
  Administered 2019-10-31: 50 ug via VAGINAL
  Filled 2019-10-31 (×2): qty 1

## 2019-10-31 MED ORDER — HYDROMORPHONE HCL 1 MG/ML IJ SOLN: 1.0000 mg | INTRAMUSCULAR | Status: DC | PRN

## 2019-10-31 MED ORDER — BUPIVACAINE HCL (PF) 0.5 % IJ SOLN
INTRAMUSCULAR | Status: AC
  Filled 2019-10-31: qty 30

## 2019-10-31 MED ORDER — SODIUM CHLORIDE 0.9% FLUSH: 3.0000 mL | INTRAVENOUS | Status: DC | PRN

## 2019-10-31 MED ORDER — FENTANYL CITRATE (PF) 100 MCG/2ML IJ SOLN
INTRAMUSCULAR | Status: DC | PRN
  Administered 2019-10-31: 15 ug via INTRATHECAL

## 2019-10-31 MED ORDER — SIMETHICONE 80 MG PO CHEW
80.0000 mg | CHEWABLE_TABLET | ORAL | Status: DC
  Administered 2019-11-01 – 2019-11-02 (×2): 80 mg via ORAL
  Filled 2019-10-31 (×2): qty 1

## 2019-10-31 MED ORDER — LACTATED RINGERS IV SOLN: INTRAVENOUS | Status: DC

## 2019-10-31 MED ORDER — SODIUM CHLORIDE 0.9% FLUSH
INTRAVENOUS | Status: DC | PRN
  Administered 2019-10-31: 50 mL via INTRADERMAL

## 2019-10-31 MED ORDER — TERBUTALINE SULFATE 1 MG/ML IJ SOLN: 0.2500 mg | Freq: Once | INTRAMUSCULAR | Status: DC | PRN

## 2019-10-31 MED ORDER — SENNOSIDES-DOCUSATE SODIUM 8.6-50 MG PO TABS
2.0000 | ORAL_TABLET | ORAL | Status: DC
  Administered 2019-11-01 – 2019-11-02 (×2): 2 via ORAL
  Filled 2019-10-31 (×2): qty 2

## 2019-10-31 MED ORDER — OXYTOCIN 10 UNIT/ML IJ SOLN
INTRAMUSCULAR | Status: AC
  Filled 2019-10-31: qty 2

## 2019-10-31 MED ORDER — NALOXONE HCL 0.4 MG/ML IJ SOLN: 0.4000 mg | INTRAMUSCULAR | Status: DC | PRN

## 2019-10-31 MED ORDER — OXYCODONE HCL 5 MG/5ML PO SOLN
5.0000 mg | Freq: Once | ORAL | Status: DC | PRN
  Filled 2019-10-31: qty 5

## 2019-10-31 MED ORDER — ACETAMINOPHEN 325 MG PO TABS
650.0000 mg | ORAL_TABLET | Freq: Four times a day (QID) | ORAL | Status: DC
  Administered 2019-10-31 (×2): 650 mg via ORAL
  Filled 2019-10-31 (×2): qty 2

## 2019-10-31 MED ORDER — DEXAMETHASONE SODIUM PHOSPHATE 10 MG/ML IJ SOLN
INTRAMUSCULAR | Status: DC | PRN
Start: 2019-10-31 — End: 2019-10-31
  Administered 2019-10-31: 5 mg via INTRAVENOUS

## 2019-10-31 MED ORDER — ONDANSETRON HCL 4 MG/2ML IJ SOLN: 4.0000 mg | Freq: Four times a day (QID) | INTRAMUSCULAR | Status: DC | PRN

## 2019-10-31 MED ORDER — MORPHINE SULFATE (PF) 0.5 MG/ML IJ SOLN
INTRAMUSCULAR | Status: DC | PRN
  Administered 2019-10-31: 100 ug via INTRATHECAL

## 2019-10-31 MED ORDER — SIMETHICONE 80 MG PO CHEW: 80.0000 mg | CHEWABLE_TABLET | ORAL | Status: DC | PRN

## 2019-10-31 MED ORDER — PRENATAL MULTIVITAMIN CH
1.0000 | ORAL_TABLET | Freq: Every day | ORAL | Status: DC
  Administered 2019-11-01: 1 via ORAL
  Filled 2019-10-31: qty 1

## 2019-10-31 MED ORDER — DIBUCAINE (PERIANAL) 1 % EX OINT
1.0000 "application " | TOPICAL_OINTMENT | CUTANEOUS | Status: DC | PRN
  Administered 2019-11-01: 1 via RECTAL
  Filled 2019-10-31: qty 28

## 2019-10-31 MED ORDER — FENTANYL CITRATE (PF) 100 MCG/2ML IJ SOLN: 25.0000 ug | INTRAMUSCULAR | Status: DC | PRN

## 2019-10-31 MED ORDER — IBUPROFEN 800 MG PO TABS
800.0000 mg | ORAL_TABLET | Freq: Four times a day (QID) | ORAL | Status: DC
  Administered 2019-11-01 – 2019-11-02 (×6): 800 mg via ORAL
  Filled 2019-10-31 (×6): qty 1

## 2019-10-31 MED ORDER — AMMONIA AROMATIC IN INHA
RESPIRATORY_TRACT | Status: AC
  Filled 2019-10-31: qty 10

## 2019-10-31 MED ORDER — FENTANYL CITRATE (PF) 100 MCG/2ML IJ SOLN
INTRAMUSCULAR | Status: AC
  Filled 2019-10-31: qty 2

## 2019-10-31 MED ORDER — OXYTOCIN-SODIUM CHLORIDE 30-0.9 UT/500ML-% IV SOLN
2.5000 [IU]/h | INTRAVENOUS | Status: DC
  Filled 2019-10-31 (×2): qty 500

## 2019-10-31 MED ORDER — OXYCODONE HCL 5 MG PO TABS: 5.0000 mg | ORAL_TABLET | Freq: Four times a day (QID) | ORAL | Status: AC | PRN

## 2019-10-31 MED ORDER — OXYTOCIN-SODIUM CHLORIDE 30-0.9 UT/500ML-% IV SOLN: 2.5000 [IU]/h | INTRAVENOUS | Status: DC

## 2019-10-31 MED ORDER — ONDANSETRON HCL 4 MG/2ML IJ SOLN
INTRAMUSCULAR | Status: DC | PRN
  Administered 2019-10-31: 4 mg via INTRAVENOUS

## 2019-10-31 SURGICAL SUPPLY — 33 items
BAG COUNTER SPONGE EZ (MISCELLANEOUS) ×2 IMPLANT
BENZOIN TINCTURE PRP APPL 2/3 (GAUZE/BANDAGES/DRESSINGS) ×3 IMPLANT
CANISTER SUCT 3000ML PPV (MISCELLANEOUS) ×3 IMPLANT
CELL SAVER LIPIGURD (MISCELLANEOUS) ×1 IMPLANT
CHLORAPREP W/TINT 26 (MISCELLANEOUS) ×6 IMPLANT
COUNTER SPONGE BAG EZ (MISCELLANEOUS) ×1
COVER WAND RF STERILE (DRAPES) ×3 IMPLANT
DRSG TELFA 3X8 NADH (GAUZE/BANDAGES/DRESSINGS) ×3 IMPLANT
ELECT REM PT RETURN 9FT ADLT (ELECTROSURGICAL) ×3
ELECTRODE REM PT RTRN 9FT ADLT (ELECTROSURGICAL) ×1 IMPLANT
EXTRT SYSTEM ALEXIS 14CM (MISCELLANEOUS) ×3
EXTRT SYSTEM ALEXIS 17CM (MISCELLANEOUS)
GAUZE SPONGE 4X4 12PLY STRL (GAUZE/BANDAGES/DRESSINGS) ×3 IMPLANT
GLOVE BIO SURGEON STRL SZ 6.5 (GLOVE) ×2 IMPLANT
GLOVE BIO SURGEONS STRL SZ 6.5 (GLOVE) ×1
GLOVE INDICATOR 7.0 STRL GRN (GLOVE) ×3 IMPLANT
GOWN STRL REUS W/ TWL LRG LVL3 (GOWN DISPOSABLE) ×2 IMPLANT
GOWN STRL REUS W/TWL LRG LVL3 (GOWN DISPOSABLE) ×4
HEMOSTAT ARISTA ABSORB 3G PWDR (HEMOSTASIS) ×3 IMPLANT
KIT TURNOVER KIT A (KITS) ×3 IMPLANT
NS IRRIG 1000ML POUR BTL (IV SOLUTION) ×3 IMPLANT
PACK C SECTION (MISCELLANEOUS) ×3 IMPLANT
PAD OB MATERNITY 4.3X12.25 (PERSONAL CARE ITEMS) ×3 IMPLANT
PAD PREP 24X41 OB/GYN DISP (PERSONAL CARE ITEMS) ×3 IMPLANT
PENCIL SMOKE ULTRAEVAC 22 CON (MISCELLANEOUS) ×3 IMPLANT
SUT MNCRL AB 4-0 PS2 18 (SUTURE) ×3 IMPLANT
SUT PLAIN 2 0 XLH (SUTURE) IMPLANT
SUT VIC AB 0 CT1 36 (SUTURE) ×12 IMPLANT
SUT VIC AB 3-0 SH 27 (SUTURE) ×2
SUT VIC AB 3-0 SH 27X BRD (SUTURE) ×1 IMPLANT
SYSTEM CONTND EXTRCTN KII BLLN (MISCELLANEOUS) IMPLANT
TAPE CLOTH 3 (GAUZE/BANDAGES/DRESSINGS) ×3 IMPLANT
TAPE STRIPS DRAPE STRL (GAUZE/BANDAGES/DRESSINGS) ×3 IMPLANT

## 2019-10-31 NOTE — Lactation Note (Signed)
This note was copied from a baby's chart. Lactation Consultation Note  Patient Name: Karen Zuniga [id number] Date: 10/31/2019 Reason for consult: Initial assessment;Primapara;1st time breastfeeding;Term;Other (Comment) (c-section; breech)  Lactation called in to assist with first breastfeeding post c-section delivery (breech). Mom is G1P1, breasts appear to be wide spaced, small amount of tissue, tubular in shape, and tight. Previous attempts to latch baby were unsuccessful.  LC assist with changing position of baby, attempt made in cross-cradle, and then moved back into football hold, where when ready baby Sonny grasped the breast tissue easily with wide open mouth, had strong rhythmic sucking with flanged top/bottom lips. Swallows were identifiable by both jaw movement and sound throughout entire 15 minute feeding. Sonny release the breast on his own and was content, and relaxed. LC reviewed with mom newborn stomach size and feeding patterns, early hunger cues, encouraged to feed with cue, encouraged skin to skin time, and discussed tracking of diapers to determine intake. Lactation name/number was written on whiteboard in LDR3 for parents to call if needed.  Maternal Data Formula Feeding for Exclusion: No Has patient been taught Hand Expression?: Yes Does the patient have breastfeeding experience prior to this delivery?: No  Feeding Feeding Type: Breast Fed  LATCH Score Latch: Grasps breast easily, tongue down, lips flanged, rhythmical sucking.  Audible Swallowing: Spontaneous and intermittent  Type of Nipple: Everted at rest and after stimulation  Comfort (Breast/Nipple): Soft / non-tender  Hold (Positioning): Assistance needed to correctly position infant at breast and maintain latch.  LATCH Score: 9  Interventions Interventions: Breast feeding basics reviewed;Assisted with latch;Hand express;Adjust position;Support pillows;Position options  Lactation Tools  Discussed/Used     Consult Status Consult Status: Follow-up Date: 10/31/19 Follow-up type: In-patient    Danford Bad 10/31/2019, 2:29 PM

## 2019-10-31 NOTE — Op Note (Addendum)
Cesarean Section Procedure Note  Indications: malpresentation: transverse  Pre-operative Diagnosis: 40 week 5 day pregnancy, postdates, fetal malpresentation at term (transverse lie).  Post-operative Diagnosis: Same  Surgeon: Hildred Laser, MD  Assistants:   Doreene Burke, CNM.  An experienced assistant was required given the standard of surgical care given the complexity of the case.  This assistant was needed for exposure, dissection, suctioning, retraction, instrument exchange, and for overall help during the procedure.  Also, Dominic Pea, [place] PA-S present.   Procedure: Primary low transverse Cesarean Section  Anesthesia: Spinal anesthesia  Findings: Female infant, cephalic presentation, 3800 grams, with Apgar scores of 8 at one minute and 9 at five minutes. Intact placenta with 3 vessel cord.  Clear amniotic fluid at amniotomy Nuchal cord x 1 (loose) present.  The uterine outline, tubes and ovaries appeared normal. Small ~5 mm posterior fibroid noted.   Procedure Details: The patient was seen in the Holding Room. The risks, benefits, complications, treatment options, and expected outcomes were discussed with the patient.  The patient concurred with the proposed plan, giving informed consent.  The site of surgery properly noted/marked. The patient was taken to the Operating Room, identified as Karen Zuniga and the procedure verified as C-Section Delivery. A Time Out was held and the above information confirmed.  After induction of anesthesia, the patient was draped and prepped in the usual sterile manner. Anesthesia was tested and noted to be adequate. A Pfannenstiel incision was made and carried down through the subcutaneous tissue to the fascia. Fascial incision was made and extended transversely. The fascia was separated from the underlying rectus tissue superiorly and inferiorly. The peritoneum was identified and entered. Peritoneal incision was extended longitudinally. The  surgical assist was able to provide retraction to allow for clear visualization of surgical site. The utero-vesical peritoneal reflection was incised transversely and the bladder flap was bluntly freed from the lower uterine segment. A low transverse uterine incision was made. Delivered from breech footling presentation (after fetal rotation performed in utero) was a 3800 gram Female with Apgar scores of 8 at one minute and 9 at five minutes.  The assistant was able to apply adequate fundal pressure to allow for successful delivery of the fetus. Nuchal cord x 1 reduced after delivery of the fetal head. The umbilical cord was clamped and cut, no cord blood was obtained for evaluation. The placenta was removed intact and appeared normal. The uterus was exteriorized and cleared of all clots and debris. The uterine outline, tubes and ovaries appeared normal.  The uterine incision was closed with running locked sutures of 0-Vicryl.  A second suture of 0-Vicryl was used in an imbricating layer. Hemostasis was observed. Lavage was carried out until clear. Arista was placed over the left lateral edge of the uterine incision due to scant oozing noted, unable to control with the bovie due to suture location. The peritoneum was reapproximated with a 3-0 Vicryl in a running fashion.  The muscle layers were reapproximated with a figure-of-eight suture of 3-0 Vicryl. The fascia was then injected with 50 ml of 1.3% Exaparel solution diluted with saline.  The fascia was reapproximated with a running suture of 0-Vicryl. The skin was reapproximated with 4-0 Monocryl.  Another 25 ml of Exparel solution was injected into the skin.    Instrument, sponge, and needle counts were correct prior the abdominal closure and at the conclusion of the case.    Estimated Blood Loss:  538 ml  Drains: foley catheter to gravity  drainage, 200 ml of clear urine at end of the procedure         Total IV Fluids:  700 ml  Specimens: None          Implants: None         Complications:  None; patient tolerated the procedure well.         Disposition: PACU - hemodynamically stable.         Condition: stable   Hildred Laser, MD [facility]

## 2019-10-31 NOTE — Anesthesia Preprocedure Evaluation (Signed)
Anesthesia Evaluation  Patient identified by MRN, date of birth, ID band Patient awake    Reviewed: Allergy & Precautions, H&P , NPO status , Patient's Chart, lab work & pertinent test results  History of Anesthesia Complications Negative for: history of anesthetic complications  Airway Mallampati: II  TM Distance: >3 FB Neck ROM: full    Dental  (+) Chipped   Pulmonary neg pulmonary ROS, neg shortness of breath,    Pulmonary exam normal        Cardiovascular Exercise Tolerance: Good (-) hypertension(-) angina(-) Past MI negative cardio ROS Normal cardiovascular exam     Neuro/Psych    GI/Hepatic negative GI ROS, neg GERD  ,  Endo/Other    Renal/GU   negative genitourinary   Musculoskeletal   Abdominal   Peds  Hematology negative hematology ROS (+)   Anesthesia Other Findings Past Medical History: No date: Ovarian cyst  Past Surgical History: No date: APPENDECTOMY 11/15/2017: LAPAROSCOPIC APPENDECTOMY; N/A     Comment:  Procedure: APPENDECTOMY LAPAROSCOPIC;  Surgeon: Leafy Ro, MD;  Location: [facility] ORS;  Service: General;                Laterality: N/A;  BMI    Body Mass Index: 29.01 kg/m      Reproductive/Obstetrics (+) Pregnancy                             Anesthesia Physical Anesthesia Plan  ASA: II  Anesthesia Plan: Spinal   Post-op Pain Management:    Induction:   PONV Risk Score and Plan:   Airway Management Planned: Natural Airway and Nasal Cannula  Additional Equipment:   Intra-op Plan:   Post-operative Plan:   Informed Consent: I have reviewed the patients History and Physical, chart, labs and discussed the procedure including the risks, benefits and alternatives for the proposed anesthesia with the patient or authorized representative who has indicated his/her understanding and acceptance.     Dental Advisory Given  Plan Discussed  with: Anesthesiologist, CRNA and Surgeon  Anesthesia Plan Comments: (Patient reports no bleeding problems and no anticoagulant use.  Plan for spinal with backup GA  Patient consented for risks of anesthesia including but not limited to:  - adverse reactions to medications - damage to eyes, teeth, lips or other oral mucosa - nerve damage due to positioning  - risk of bleeding, infection and or nerve damage from spinal that could lead to paralysis - risk of headache or failed spinal - damage to teeth, lips or other oral mucosa - sore throat or hoarseness - damage to heart, brain, nerves, lungs, other parts of body or loss of life  Patient voiced understanding.)        Anesthesia Quick Evaluation

## 2019-10-31 NOTE — Anesthesia Procedure Notes (Signed)
Date/Time: 10/31/2019 11:15 AM Performed by: Stormy Fabian, CRNA Pre-anesthesia Checklist: Patient identified, Emergency Drugs available, Suction available and Patient being monitored Patient Re-evaluated:Patient Re-evaluated prior to induction Oxygen Delivery Method: Nasal cannula Induction Type: IV induction Dental Injury: Teeth and Oropharynx as per pre-operative assessment  Comments: Nasal cannula with etCO2 monitoring

## 2019-10-31 NOTE — Progress Notes (Signed)
OB Progress Note:   Called to assess patient by Doreene Burke, CNM due to concern for fetal malpresentation during scheduled IOL for post-dates pregnancy at 40.[redacted] weeks gestation. She has received 1 dose of Cytotec this morning and was due for placement of second dose when fetal malpresentation was noted.   Previous in office ultrasound and exam 2 days ago noted vertex presentation. Today on bedside scan fetal presentation is breech (borderline transverse presentation with head to maternal right). Leopold's maneuvers noted that fetus was well-engaged in current position, with limited further movement capable forward or backward.    Discussed at this time that the best recommended route of delivery was via C-section. The risks of surgery were discussed with the patient including but were not limited to: bleeding which may require transfusion or reoperation; infection which may require antibiotics; injury to bowel, bladder, ureters or other surrounding organs; injury to the fetus; need for additional procedures including hysterectomy in the event of a life-threatening hemorrhage; formation of adhesions; placental abnormalities wth subsequent pregnancies; incisional problems; thromboembolic phenomenon and other postoperative/anesthesia complications.  The patient concurred with the proposed plan, giving informed written consent for the procedure.   Patient has been NPO since 3:30 a.m. she will remain NPO for procedure. Anesthesia and OR aware. Preoperative prophylactic antibiotics and SCDs ordered on call to the OR.  To OR when ready.   Hildred Laser, MD [facility]

## 2019-10-31 NOTE — H&P (Signed)
History and Physical   HPI  Karen Zuniga is a 29 y.o. G1P0000 at [redacted]w[redacted]d Estimated Date of Delivery: 10/26/19 who is being admitted for induction of labor due to post dates.   OB History  OB History  Gravida Para Term Preterm AB Living  1 0 0 0 0 0  SAB TAB Ectopic Multiple Live Births  0 0 0 0 0    # Outcome Date GA Lbr Len/2nd Weight Sex Delivery Anes PTL Lv  1 Current             PROBLEM LIST  Pregnancy complications or risks: Patient Active Problem List   Diagnosis Date Noted  . Labor and delivery, indication for care 10/31/2019  . Rubella non-immune status, antepartum 08/06/2019  . Type AB blood, Rh positive 08/06/2019  . Acute appendicitis     Prenatal labs and studies: ABO, Rh: --/--/AB POS (07/15 0510) Antibody: NEG (07/15 0510) Rubella: 1.00 (12/09 1050) RPR: Non Reactive (04/19 0958)  HBsAg: Negative (12/09 1050)  HIV: Non Reactive (12/09 1050)  GBS:--/Negative (06/14 1647)   Past Medical History:  Diagnosis Date  . Ovarian cyst      Past Surgical History:  Procedure Laterality Date  . APPENDECTOMY    . LAPAROSCOPIC APPENDECTOMY N/A 11/15/2017   Procedure: APPENDECTOMY LAPAROSCOPIC;  Surgeon: Leafy Ro, MD;  Location: [facility] ORS;  Service: General;  Laterality: N/A;     Medications    Current Discharge Medication List    CONTINUE these medications which have NOT CHANGED   Details  loratadine (ALLERGY) 10 MG tablet Take 10 mg by mouth daily.    Prenatal Vit-Fe Fumarate-FA (MULTIVITAMIN-PRENATAL) 27-0.8 MG TABS tablet Take 1 tablet by mouth daily at 12 noon.    EPINEPHrine 0.3 mg/0.3 mL IJ SOAJ injection Inject 3 mg into the muscle as needed.         Allergies  Amoxicillin  Review of Systems  Constitutional: negative Eyes: negative Ears, nose, mouth, throat, and face: negative Respiratory: negative Cardiovascular: negative Gastrointestinal: negative Genitourinary:negative Integument/breast:  negative Hematologic/lymphatic: negative Musculoskeletal:negative Neurological: negative Behavioral/Psych: negative Endocrine: negative Allergic/Immunologic: negative  Physical Exam  BP 127/88 (BP Location: Left Arm)   Pulse 74   Temp 97.7 F (36.5 C) (Oral)   Resp 16   Ht 5\' 4"  (1.626 m)   Wt 76.6 kg   LMP 01/19/2019 (Exact Date)   BMI 29.01 kg/m   Lungs:  CTA B Cardio: RRR  Abd: Soft, gravid, NT Presentation: transverse. Dr. 03/21/2019 notified to do bedside u/s. EXT: No C/C/ 1+ Edema DTRs: 2+ B CERVIX: Dilation: 1.5 Effacement (%): 70 Cervical Position: Posterior Station: -3 Exam by:: [email or web address] CNM  See Prenatal records for more detailed PE.     FHR:  Baseline: 140 bpm, Variability: Good {> 6 bpm), Accelerations: Reactive and Decelerations: Absent  Toco: Uterine Contractions: Frequency: Every 2 minutes and Intensity: mild  Test Results  Results for orders placed or performed during the hospital encounter of 10/31/19 (from the past 24 hour(s))  CBC     Status: None   Collection Time: 10/31/19  3:57 AM  Result Value Ref Range   WBC 9.0 4.0 - 10.5 K/uL   RBC 3.87 3.87 - 5.11 MIL/uL   Hemoglobin 13.0 12.0 - 15.0 g/dL   HCT 11/02/19 36 - 46 %   MCV 94.1 80.0 - 100.0 fL   MCH 33.6 26.0 - 34.0 pg   MCHC 35.7 30.0 - 36.0 g/dL   RDW 12.4  11.5 - 15.5 %   Platelets 181 150 - 400 K/uL   nRBC 0.0 0.0 - 0.2 %  Type and screen     Status: None (Preliminary result)   Collection Time: 10/31/19  3:57 AM  Result Value Ref Range   ABO/RH(D) PENDING    Antibody Screen PENDING    Sample Expiration      11/03/2019,2359 Performed at [facility], [address] [place], [place] [address]   ABO/Rh     Status: None   Collection Time: 10/31/19  3:57 AM  Result Value Ref Range   ABO/RH(D)      AB POS Performed at [facility], [address] [place], [place] [address]   SARS Coronavirus 2 by RT PCR (hospital order, performed in [facility] hospital lab)  Nasopharyngeal Nasopharyngeal Swab     Status: None   Collection Time: 10/31/19  3:58 AM   Specimen: Nasopharyngeal Swab  Result Value Ref Range   SARS Coronavirus 2 NEGATIVE NEGATIVE  Type and screen     Status: None   Collection Time: 10/31/19  5:10 AM  Result Value Ref Range   ABO/RH(D) AB POS    Antibody Screen NEG    Sample Expiration      11/03/2019,2359 Performed at [facility], [address] [place], [place] [address]    Group B Strep negative  Assessment   G1P0000 at [redacted]w[redacted]d Estimated Date of Delivery: 10/26/19  The fetus is reassuring.   Patient Active Problem List   Diagnosis Date Noted  . Labor and delivery, indication for care 10/31/2019  . Rubella non-immune status, antepartum 08/06/2019  . Type AB blood, Rh positive 08/06/2019  . Acute appendicitis     Plan  1. Admitted to L&D :   2. EFM:-- Category 1 3. Stadol or Epidural if desired.   4. Admission labs completd 5. Dr. Valentino Saxon notified , plan bedside u/s for presentation.   Doreene Burke, CNM  10/31/2019 8:25 AM

## 2019-10-31 NOTE — Transfer of Care (Signed)
Immediate Anesthesia Transfer of Care Note  Patient: Karen Zuniga  Procedure(s) Performed: CESAREAN SECTION (N/A )  Patient Location: PACU and Mother/Baby  Anesthesia Type:Spinal  Level of Consciousness: awake, alert  and oriented  Airway & Oxygen Therapy: Patient Spontanous Breathing  Post-op Assessment: Report given to RN and Post -op Vital signs reviewed and stable  Post vital signs: Reviewed and stable  Last Vitals:  Vitals Value Taken Time  BP    Temp    Pulse    Resp    SpO2      Last Pain:  Vitals:   10/31/19 0710  TempSrc: Oral  PainSc: 0-No pain         Complications: No complications documented.

## 2019-10-31 NOTE — Anesthesia Procedure Notes (Addendum)
Spinal  Patient location during procedure: OR Start time: 10/31/2019 11:20 AM End time: 10/31/2019 11:22 AM Staffing Performed: resident/CRNA  Anesthesiologist: Piscitello, Precious Haws, MD Resident/CRNA: Doreen Salvage, CRNA Preanesthetic Checklist Completed: patient identified, IV checked, site marked, risks and benefits discussed, surgical consent, monitors and equipment checked, pre-op evaluation and timeout performed Spinal Block Patient position: sitting Prep: ChloraPrep Patient monitoring: heart rate, continuous pulse ox, blood pressure and cardiac monitor Approach: midline Location: L3-4 Injection technique: single-shot Needle Needle type: Introducer and Pencan  Needle gauge: 24 G Needle length: 9 cm Assessment Sensory level: T10 Additional Notes Negative paresthesia. Negative blood return. Positive free-flowing CSF. Expiration date of kit checked and confirmed. Patient tolerated procedure well, without complications.

## 2019-11-01 ENCOUNTER — Encounter: Payer: Self-pay | Admitting: Obstetrics and Gynecology

## 2019-11-01 LAB — CBC
HCT: 28.3 % — ABNORMAL LOW (ref 36.0–46.0)
Hemoglobin: 10.1 g/dL — ABNORMAL LOW (ref 12.0–15.0)
MCH: 33.8 pg (ref 26.0–34.0)
MCHC: 35.7 g/dL (ref 30.0–36.0)
MCV: 94.6 fL (ref 80.0–100.0)
Platelets: 157 10*3/uL (ref 150–400)
RBC: 2.99 MIL/uL — ABNORMAL LOW (ref 3.87–5.11)
RDW: 12.7 % (ref 11.5–15.5)
WBC: 14.6 10*3/uL — ABNORMAL HIGH (ref 4.0–10.5)
nRBC: 0 % (ref 0.0–0.2)

## 2019-11-01 LAB — RPR: RPR Ser Ql: NONREACTIVE

## 2019-11-01 NOTE — Progress Notes (Signed)
Postpartum Day # 1: Cesarean Delivery  Subjective: Patient reports tolerating PO, + flatus and no problems voiding.Pain is well controlled.     Objective: Vital signs in last 24 hours: Temp:  [97.7 F (36.5 C)-98.4 F (36.9 C)] 98.4 F (36.9 C) (07/16 0331) Pulse Rate:  [70-93] 71 (07/16 0500) Resp:  [18-20] 18 (07/16 0331) BP: (97-110)/(68-78) 110/68 (07/16 0331) SpO2:  [94 %-100 %] 96 % (07/16 0500)  Physical Exam:  General: alert and no distress Lungs: clear to auscultation bilaterally Breasts: normal appearance, no masses or tenderness Heart: regular rate and rhythm, S1, S2 normal, no murmur, click, rub or gallop Abdomen: soft, non-tender; bowel sounds normal; no masses,  no organomegaly Pelvis: Lochia appropriate, Uterine Fundus firm, Incision: healing well, no significant drainage, no dehiscence, no significant erythema Extremities: DVT Evaluation: No evidence of DVT seen on physical exam. Negative Homan's sign. No cords or calf tenderness. No significant calf/ankle edema.  Recent Labs    10/31/19 0357 11/01/19 0554  HGB 13.0 10.1*  HCT 36.4 28.3*    Assessment/Plan: Status post Cesarean section. Doing well postoperatively.  Breastfeeding, Lactation consult Circumcision prior to discharge  Contraception: condoms. Regular diet Continue PO pain management Mild anemia post-operatively due to acute blood loss. Asymptomatic. Can continue PNV with iron.  Continue current care.  Hildred Laser, MD [facility]

## 2019-11-01 NOTE — Lactation Note (Signed)
This note was copied from a baby's chart. Lactation Consultation Note  Patient Name: Karen Zuniga [id number] Date: 11/01/2019 Reason for consult: Follow-up assessment;Primapara   Maternal Data Has patient been taught Hand Expression?: Yes Does the patient have breastfeeding experience prior to this delivery?: No  Feeding Feeding Type: Breast Fed BAby latches easily and audible swallows present, burped between breasts LATCH Score Latch: Grasps breast easily, tongue down, lips flanged, rhythmical sucking.  Audible Swallowing: Spontaneous and intermittent  Type of Nipple: Everted at rest and after stimulation  Comfort (Breast/Nipple): Soft / non-tender  Hold (Positioning): Assistance needed to correctly position infant at breast and maintain latch.  LATCH Score: 9  Interventions Interventions: Adjust position;Support pillows  Lactation Tools Discussed/Used WIC Program: No LC name and no written on white board  Consult Status Consult Status: Follow-up Date: 11/02/19 Follow-up type: In-patient    Dyann Kief 11/01/2019, 5:00 PM

## 2019-11-01 NOTE — Anesthesia Postprocedure Evaluation (Signed)
Anesthesia Post Note  Patient: Karen Zuniga  Procedure(s) Performed: CESAREAN SECTION (N/A )  Patient location during evaluation: L&D Anesthesia Type: Spinal Level of consciousness: awake, oriented and awake and alert Pain management: pain level controlled Vital Signs Assessment: post-procedure vital signs reviewed and stable Respiratory status: spontaneous breathing, nonlabored ventilation and respiratory function stable Cardiovascular status: blood pressure returned to baseline and stable Postop Assessment: no headache and no backache   No complications documented.   Last Vitals:  Vitals:   11/01/19 0331 11/01/19 0500  BP: 110/68   Pulse: 74 71  Resp: 18   Temp: 36.9 C   SpO2: 100% 96%    Last Pain:  Vitals:   11/01/19 0331  TempSrc: Oral  PainSc:                  Ginger Carne

## 2019-11-01 NOTE — Anesthesia Post-op Follow-up Note (Signed)
°  Anesthesia Pain Follow-up Note  Patient: Karen Zuniga  Day #: 1  Date of Follow-up: 11/01/2019 Time: 7:18 AM  Last Vitals:  Vitals:   11/01/19 0331 11/01/19 0500  BP: 110/68   Pulse: 74 71  Resp: 18   Temp: 36.9 C   SpO2: 100% 96%    Level of Consciousness: alert  Pain: mild   Side Effects:None  Catheter Site Exam:clean, dry     Plan: D/C from anesthesia care at surgeon's request  [facility]

## 2019-11-01 NOTE — Anesthesia Post-op Follow-up Note (Signed)
  Anesthesia Pain Follow-up Note ° °Patient: Karen Zuniga ° °Day #: 1 ° °Date of Follow-up: 11/01/2019 Time: 7:18 AM ° °Last Vitals:  °Vitals:  ° 11/01/19 0331 11/01/19 0500  °BP: 110/68   °Pulse: 74 71  °Resp: 18   °Temp: 36.9 °C   °SpO2: 100% 96%  ° ° °Level of Consciousness: alert ° °Pain: mild  ° °Side Effects:None ° °Catheter Site Exam:clean, dry ° ° ° ° °Plan: D/C from anesthesia care at surgeon's request ° °Zackari Ruane ° ° ° °

## 2019-11-02 MED ORDER — GABAPENTIN 300 MG PO CAPS: 300.0000 mg | ORAL_CAPSULE | Freq: Two times a day (BID) | ORAL | 0 refills | Status: DC

## 2019-11-02 MED ORDER — IBUPROFEN 800 MG PO TABS: 800.0000 mg | ORAL_TABLET | Freq: Four times a day (QID) | ORAL | 0 refills | Status: DC

## 2019-11-02 MED ORDER — FERROUS SULFATE 325 (65 FE) MG PO TABS: 325.0000 mg | ORAL_TABLET | Freq: Two times a day (BID) | ORAL | 3 refills | Status: AC

## 2019-11-02 MED ORDER — TRAMADOL HCL 50 MG PO TABS: 50.0000 mg | ORAL_TABLET | Freq: Four times a day (QID) | ORAL | 0 refills | Status: DC | PRN

## 2019-11-02 NOTE — Discharge Instructions (Signed)

## 2019-11-02 NOTE — Progress Notes (Signed)
Patient discharged home with infant. Discharge instructions and prescriptions given and reviewed with patient. Patient verbalized understanding. Escorted out by auxillary.  

## 2019-11-02 NOTE — Discharge Summary (Signed)
Obstetric Discharge Summary  Patient ID: ALAYNE ESTRELLA MRN: [id number] DOB/AGE: 01/11/1991 29 y.o.   Date of Admission: 10/31/2019  Date of Discharge: 11/02/19  Admitting Diagnosis: Induction of labor at [redacted]w[redacted]d  Secondary Diagnosis: Postpartum anemia due to blood loss, Fetal malpresentation  Mode of Delivery: Primary cesarean section- low uterine, transverse     Discharge Diagnosis: Reasons for cesarean section  Malpresentation transverse   Intrapartum Procedures: Atificial rupture of membranes and cytotec induction   Post partum procedures: None  Complications: none   Brief Hospital Course    BITA CARTWRIGHT is a G1P1001 who underwent cesarean section on 10/31/2019.  Patient had an uncomplicated surgery; for further details of this surgery, please refer to the operative note.  Patient had an uncomplicated postpartum course.  By time of discharge on POD#2/PPD#2, her pain was controlled on oral pain medications; she had appropriate lochia and was ambulating, voiding without difficulty, tolerating regular diet and passing flatus.   She was deemed stable for discharge to home.    Labs: CBC Latest Ref Rng & Units 11/01/2019 10/31/2019 08/05/2019  WBC 4.0 - 10.5 K/uL 14.6(H) 9.0 7.4  Hemoglobin 12.0 - 15.0 g/dL 10.1(L) 13.0 12.7  Hematocrit 36 - 46 % 28.3(L) 36.4 37.2  Platelets 150 - 400 K/uL 157 181 232   AB POS  Physical exam:   Temp:  [97.8 F (36.6 C)-98.4 F (36.9 C)] 97.8 F (36.6 C) (07/17 0750) Pulse Rate:  [89-98] 90 (07/17 0750) Resp:  [18-20] 18 (07/17 0750) BP: (101-106)/(66-76) 106/76 (07/17 0750) SpO2:  [99 %-100 %] 100 % (07/17 0750)  General: alert and no distress  Lochia: appropriate  Abdomen: soft, NT  Uterine Fundus: firm  Incision: healing well, no significant drainage, no dehiscence, no significant erythema  Extremities: No evidence of DVT seen on physical exam. No lower extremity edema.  Edinburgh Postnatal Depression Scale Screening Tool  11/02/2019 10/31/2019 10/31/2019  I have been able to laugh and see the funny side of things. (No Data) 0 (No Data)  I have looked forward with enjoyment to things. - 0 -  I have blamed myself unnecessarily when things went wrong. - 0 -  I have been anxious or worried for no good reason. - 0 -  I have felt scared or panicky for no good reason. - 0 -  Things have been getting on top of me. - 0 -  I have been so unhappy that I have had difficulty sleeping. - 0 -  I have felt sad or miserable. - 0 -  I have been so unhappy that I have been crying. - 0 -  The thought of harming myself has occurred to me. - 0 -  Edinburgh Postnatal Depression Scale Total - 0 -     Discharge Instructions: Per After Visit Summary.  Activity: Advance as tolerated. Pelvic rest for 6 weeks.  Also refer to After Visit Summary  Diet: Regular  Medications: Allergies as of 11/02/2019      Reactions   Amoxicillin    Has patient had a PCN reaction causing immediate rash, facial/tongue/throat swelling, SOB or lightheadedness with hypotension: Unknown Has patient had a PCN reaction causing severe rash involving mucus membranes or skin necrosis: Unknown Has patient had a PCN reaction that required hospitalization: Unknown Has patient had a PCN reaction occurring within the last 10 years: Unknown If all of the above answers are "NO", then may proceed with Cephalosporin use.      Medication List  TAKE these medications   Allergy 10 MG tablet Generic drug: loratadine Take 10 mg by mouth daily.   EPINEPHrine 0.3 mg/0.3 mL Soaj injection Commonly known as: EPI-PEN Inject 3 mg into the muscle as needed.   ferrous sulfate 325 (65 FE) MG tablet Take 1 tablet (325 mg total) by mouth 2 (two) times daily with a meal.   gabapentin 300 MG capsule Commonly known as: NEURONTIN Take 1 capsule (300 mg total) by mouth 2 (two) times daily.   ibuprofen 800 MG tablet Commonly known as: ADVIL Take 1 tablet (800 mg total) by  mouth every 6 (six) hours.   multivitamin-prenatal 27-0.8 MG Tabs tablet Take 1 tablet by mouth daily at 12 noon.   traMADol 50 MG tablet Commonly known as: ULTRAM Take 1 tablet (50 mg total) by mouth every 6 (six) hours as needed (mild pain).            Discharge Care Instructions  (From admission, onward)         Start     Ordered   11/02/19 0000  Leave dressing on - Keep it clean, dry, and intact until clinic visit        11/02/19 1047         Outpatient follow up:   Follow-up Information    Doreene Burke, CNM Follow up.   Specialties: Certified Nurse Midwife, Radiology Why: Please schedule one (1) week incision check and five (5) week postpartum visit with Doreene Burke Contact information: [address] [place] [address] [phone]              Postpartum contraception: condoms  Discharged Condition: stable  Discharged to: home   Newborn Data:  Disposition:home with mother  Apgars: APGAR (1 MIN): 8   APGAR (5 MINS): 9     Baby Feeding: Breast   Serafina Royals, CNM [facility], [facility] 11/02/19 10:50 AM

## 2019-11-04 ENCOUNTER — Telehealth: Payer: Self-pay | Admitting: *Deleted

## 2019-11-04 NOTE — Telephone Encounter (Signed)
Patient returned call. Patient informed of risk of Ecoli infection from the [place] [facility] [place]'s water supply during the time of recent procedure at [facility]. Patient advised to notify PCP if symptoms such as nausea, vomiting, diarrhea or stomach cramping or pain develop.Pt verbalized understanding. No other concerns voiced at this time.

## 2019-11-04 NOTE — Telephone Encounter (Signed)
Patient called and left message to return call. Patient will need to be informed of water issue with the [facility] during the time of recent procedure at [facility]. If patient returns call please transfer to [facility], Triage RN.

## 2019-11-13 ENCOUNTER — Ambulatory Visit (INDEPENDENT_AMBULATORY_CARE_PROVIDER_SITE_OTHER): Payer: BC Managed Care – PPO | Admitting: Certified Nurse Midwife

## 2019-11-13 ENCOUNTER — Encounter: Payer: Self-pay | Admitting: Certified Nurse Midwife

## 2019-11-13 VITALS — BP 116/79 | HR 94 | Ht 64.0 in | Wt 151.2 lb

## 2019-11-13 DIAGNOSIS — Z09 Encounter for follow-up examination after completed treatment for conditions other than malignant neoplasm: Secondary | ICD-10-CM

## 2019-11-13 NOTE — Patient Instructions (Signed)
Cesarean Delivery, Care After This sheet gives you information about how to care for yourself after your procedure. Your health care provider may also give you more specific instructions. If you have problems or questions, contact your health care provider. What can I expect after the procedure? After the procedure, it is common to have:  A small amount of blood or clear fluid coming from the incision.  Some redness, swelling, and pain in your incision area.  Some abdominal pain and soreness.  Vaginal bleeding (lochia). Even though you did not have a vaginal delivery, you will still have vaginal bleeding and discharge.  Pelvic cramps.  Fatigue. You may have pain, swelling, and discomfort in the tissue between your vagina and your anus (perineum) if:  Your C-section was unplanned, and you were allowed to labor and push.  An incision was made in the area (episiotomy) or the tissue tore during attempted vaginal delivery. Follow these instructions at home: Incision care   Follow instructions from your health care provider about how to take care of your incision. Make sure you: ? Wash your hands with soap and water before you change your bandage (dressing). If soap and water are not available, use hand sanitizer. ? If you have a dressing, change it or remove it as told by your health care provider. ? Leave stitches (sutures), skin staples, skin glue, or adhesive strips in place. These skin closures may need to stay in place for 2 weeks or longer. If adhesive strip edges start to loosen and curl up, you may trim the loose edges. Do not remove adhesive strips completely unless your health care provider tells you to do that.  Check your incision area every day for signs of infection. Check for: ? More redness, swelling, or pain. ? More fluid or blood. ? Warmth. ? Pus or a bad smell.  Do not take baths, swim, or use a hot tub until your health care provider says it's okay. Ask your health  care provider if you can take showers.  When you cough or sneeze, hug a pillow. This helps with pain and decreases the chance of your incision opening up (dehiscing). Do this until your incision heals. Medicines  Take over-the-counter and prescription medicines only as told by your health care provider.  If you were prescribed an antibiotic medicine, take it as told by your health care provider. Do not stop taking the antibiotic even if you start to feel better.  Do not drive or use heavy machinery while taking prescription pain medicine. Lifestyle  Do not drink alcohol. This is especially important if you are breastfeeding or taking pain medicine.  Do not use any products that contain nicotine or tobacco, such as cigarettes, e-cigarettes, and chewing tobacco. If you need help quitting, ask your health care provider. Eating and drinking  Drink at least 8 eight-ounce glasses of water every day unless told not to by your health care provider. If you breastfeed, you may need to drink even more water.  Eat high-fiber foods every day. These foods may help prevent or relieve constipation. High-fiber foods include: ? Whole grain cereals and breads. ? Brown rice. ? Beans. ? Fresh fruits and vegetables. Activity   If possible, have someone help you care for your baby and help with household activities for at least a few days after you leave the hospital.  Return to your normal activities as told by your health care provider. Ask your health care provider what activities are safe for   you.  Rest as much as possible. Try to rest or take a nap while your baby is sleeping.  Do not lift anything that is heavier than 10 lbs (4.5 kg), or the limit that you were told, until your health care provider says that it is safe.  Talk with your health care provider about when you can engage in sexual activity. This may depend on your: ? Risk of infection. ? How fast you heal. ? Comfort and desire to  engage in sexual activity. General instructions  Do not use tampons or douches until your health care provider approves.  Wear loose, comfortable clothing and a supportive and well-fitting bra.  Keep your perineum clean and dry. Wipe from front to back when you use the toilet.  If you pass a blood clot, save it and call your health care provider to discuss. Do not flush blood clots down the toilet before you get instructions from your health care provider.  Keep all follow-up visits for you and your baby as told by your health care provider. This is important. Contact a health care provider if:  You have: ? A fever. ? Bad-smelling vaginal discharge. ? Pus or a bad smell coming from your incision. ? Difficulty or pain when urinating. ? A sudden increase or decrease in the frequency of your bowel movements. ? More redness, swelling, or pain around your incision. ? More fluid or blood coming from your incision. ? A rash. ? Nausea. ? Little or no interest in activities you used to enjoy. ? Questions about caring for yourself or your baby.  Your incision feels warm to the touch.  Your breasts turn red or become painful or hard.  You feel unusually sad or worried.  You vomit.  You pass a blood clot from your vagina.  You urinate more than usual.  You are dizzy or light-headed. Get help right away if:  You have: ? Pain that does not go away or get better with medicine. ? Chest pain. ? Difficulty breathing. ? Blurred vision or spots in your vision. ? Thoughts about hurting yourself or your baby. ? New pain in your abdomen or in one of your legs. ? A severe headache.  You faint.  You bleed from your vagina so much that you fill more than one sanitary pad in one hour. Bleeding should not be heavier than your heaviest period. Summary  After the procedure, it is common to have pain at your incision site, abdominal cramping, and slight bleeding from your vagina.  Check  your incision area every day for signs of infection.  Tell your health care provider about any unusual symptoms.  Keep all follow-up visits for you and your baby as told by your health care provider. This information is not intended to replace advice given to you by your health care provider. Make sure you discuss any questions you have with your health care provider. Document Revised: 10/11/2017 Document Reviewed: 10/11/2017 Elsevier Patient Education  2020 [employer]  

## 2019-11-13 NOTE — Progress Notes (Signed)
    OBSTETRICS/GYNECOLOGY POST-OPERATIVE CLINIC VISIT  Subjective:     Karen Zuniga is a 29 y.o. female who presents to the clinic 2 weeks status post primary cesarean section for breech presentation. Eating a regular diet without difficulty. Bowel movements are normal. Pain is controlled with current analgesics. Medications being used: ibuprofen (OTC).  The following portions of the patient's history were reviewed and updated as appropriate: allergies, current medications, past family history, past medical history, past social history, past surgical history and problem list.  Review of Systems Pertinent items are noted in HPI.    Objective:    BP 116/79   Pulse 94   Ht 5\' 4"  (1.626 m)   Wt 151 lb 3.2 oz (68.6 kg)   LMP  (LMP Unknown)   Breastfeeding Yes   BMI 25.95 kg/m  General:  alert and no distress  Abdomen: soft, bowel sounds active, non-tender  Incision:   healing well, no drainage, no erythema, no hernia, no seroma, no swelling, no dehiscence, incision well approximated    Pathology:    Assessment:    Doing well postoperatively.   Plan:   1. Continue any current medications. 2. Wound care discussed. 3. Operative findings again reviewed. Pathology report discussed. 4. Activity restrictions: no lifting more than 10 pounds 5. Anticipated return to work: 6-8 weeks. 6. Follow up: 4 weeks for postpartum visit.    , CNM [facility]

## 2019-12-03 ENCOUNTER — Other Ambulatory Visit: Payer: Self-pay

## 2019-12-03 ENCOUNTER — Ambulatory Visit (INDEPENDENT_AMBULATORY_CARE_PROVIDER_SITE_OTHER): Payer: BC Managed Care – PPO | Admitting: Certified Nurse Midwife

## 2019-12-03 ENCOUNTER — Encounter: Payer: Self-pay | Admitting: Certified Nurse Midwife

## 2019-12-03 NOTE — Patient Instructions (Signed)
Preventive Care 21-27 Years Old, Female Preventive care refers to visits with your health care provider and lifestyle choices that can promote health and wellness. This includes:  A yearly physical exam. This may also be called an annual well check.  Regular dental visits and eye exams.  Immunizations.  Screening for certain conditions.  Healthy lifestyle choices, such as eating a healthy diet, getting regular exercise, not using drugs or products that contain nicotine and tobacco, and limiting alcohol use. What can I expect for my preventive care visit? Physical exam Your health care provider will check your:  Height and weight. This may be used to calculate body mass index (BMI), which tells if you are at a healthy weight.  Heart rate and blood pressure.  Skin for abnormal spots. Counseling Your health care provider may ask you questions about your:  Alcohol, tobacco, and drug use.  Emotional well-being.  Home and relationship well-being.  Sexual activity.  Eating habits.  Work and work environment.  Method of birth control.  Menstrual cycle.  Pregnancy history. What immunizations do I need?  Influenza (flu) vaccine  This is recommended every year. Tetanus, diphtheria, and pertussis (Tdap) vaccine  You may need a Td booster every 10 years. Varicella (chickenpox) vaccine  You may need this if you have not been vaccinated. Human papillomavirus (HPV) vaccine  If recommended by your health care provider, you may need three doses over 6 months. Measles, mumps, and rubella (MMR) vaccine  You may need at least one dose of MMR. You may also need a second dose. Meningococcal conjugate (MenACWY) vaccine  One dose is recommended if you are age 19-21 years and a first-year college student living in a residence hall, or if you have one of several medical conditions. You may also need additional booster doses. Pneumococcal conjugate (PCV13) vaccine  You may need  this if you have certain conditions and were not previously vaccinated. Pneumococcal polysaccharide (PPSV23) vaccine  You may need one or two doses if you smoke cigarettes or if you have certain conditions. Hepatitis A vaccine  You may need this if you have certain conditions or if you travel or work in places where you may be exposed to hepatitis A. Hepatitis B vaccine  You may need this if you have certain conditions or if you travel or work in places where you may be exposed to hepatitis B. Haemophilus influenzae type b (Hib) vaccine  You may need this if you have certain conditions. You may receive vaccines as individual doses or as more than one vaccine together in one shot (combination vaccines). Talk with your health care provider about the risks and benefits of combination vaccines. What tests do I need?  Blood tests  Lipid and cholesterol levels. These may be checked every 5 years starting at age 20.  Hepatitis C test.  Hepatitis B test. Screening  Diabetes screening. This is done by checking your blood sugar (glucose) after you have not eaten for a while (fasting).  Sexually transmitted disease (STD) testing.  BRCA-related cancer screening. This may be done if you have a family history of breast, ovarian, tubal, or peritoneal cancers.  Pelvic exam and Pap test. This may be done every 3 years starting at age 21. Starting at age 30, this may be done every 5 years if you have a Pap test in combination with an HPV test. Talk with your health care provider about your test results, treatment options, and if necessary, the need for more tests.   Follow these instructions at home: Eating and drinking   Eat a diet that includes fresh fruits and vegetables, whole grains, lean protein, and low-fat dairy.  Take vitamin and mineral supplements as recommended by your health care provider.  Do not drink alcohol if: ? Your health care provider tells you not to drink. ? You are  pregnant, may be pregnant, or are planning to become pregnant.  If you drink alcohol: ? Limit how much you have to 0-1 drink a day. ? Be aware of how much alcohol is in your drink. In the [place] one drink equals one 12 oz bottle of beer (355 mL), one 5 oz glass of wine (148 mL), or one 1 oz glass of hard liquor (44 mL). Lifestyle  Take daily care of your teeth and gums.  Stay active. Exercise for at least 30 minutes on 5 or more days each week.  Do not use any products that contain nicotine or tobacco, such as cigarettes, e-cigarettes, and chewing tobacco. If you need help quitting, ask your health care provider.  If you are sexually active, practice safe sex. Use a condom or other form of birth control (contraception) in order to prevent pregnancy and STIs (sexually transmitted infections). If you plan to become pregnant, see your health care provider for a preconception visit. What's next?  Visit your health care provider once a year for a well check visit.  Ask your health care provider how often you should have your eyes and teeth checked.  Stay up to date on all vaccines. This information is not intended to replace advice given to you by your health care provider. Make sure you discuss any questions you have with your health care provider. Document Revised: 12/14/2017 Document Reviewed: 12/14/2017 Elsevier Patient Education  2020 [employer].

## 2019-12-03 NOTE — Progress Notes (Signed)
Subjective:    MAXINE FREDMAN is a 29 y.o. [id number] Caucasian female who presents for a postpartum visit. She is 6 weeks postpartum following a primary cesarean section, low transverse incision at 40.5 gestational weeks. Anesthesia: spinal. I have fully reviewed the prenatal and intrapartum course. Postpartum course has been normal Baby's course has been normal. Baby is feeding by breast. Bleeding no bleeding. Bowel function is normal. Bladder function is normal. Patient is not sexually active.. Contraception method is condoms. Postpartum depression screening: negative. Score 0.  Last pap 04/10/2019 and was negative .  The following portions of the patient's history were reviewed and updated as appropriate: allergies, current medications, past medical history, past surgical history and problem list.  Review of Systems Pertinent items are noted in HPI.   Vitals:   12/03/19 1416  BP: 104/77  Pulse: 89  Weight: 150 lb 8 oz (68.3 kg)  Height: 5\' 4"  (1.626 m)   No LMP recorded (lmp unknown).  Objective:   General:  alert, cooperative and no distress   Breasts:  deferred, no complaints  Lungs: clear to auscultation bilaterally  Heart:  regular rate and rhythm  Abdomen: soft, nontender   Vulva: normal  Vagina: normal vagina  Cervix:  closed  Corpus: Well-involuted  Adnexa:  Non-palpable  Rectal Exam: no hemorrhoids        Assessment:   Postpartum exam 6 wks s/p primary c section for breech Breast feeding Depression screening Contraception counseling   Plan:  : condoms Follow up in: 9 month for annual  or earlier if needed  , CNM

## 2022-01-31 ENCOUNTER — Encounter: Payer: Self-pay | Admitting: Certified Nurse Midwife
